# Patient Record
Sex: Male | Born: 1935 | Race: White | Hispanic: No | Smoking: Never smoker
Health system: Southern US, Community
[De-identification: ages and names within clinical notes are randomized; demographics above are authoritative.]

## PROBLEM LIST (undated history)

## (undated) DIAGNOSIS — C679 Malignant neoplasm of bladder, unspecified: Secondary | ICD-10-CM

## (undated) HISTORY — PX: APPENDECTOMY: SHX54

---

## 2001-02-13 ENCOUNTER — Ambulatory Visit (HOSPITAL_COMMUNITY): Admission: RE | Admit: 2001-02-13 | Discharge: 2001-02-13 | Payer: Self-pay | Admitting: Urology

## 2001-02-13 ENCOUNTER — Encounter (INDEPENDENT_AMBULATORY_CARE_PROVIDER_SITE_OTHER): Payer: Self-pay

## 2001-02-16 ENCOUNTER — Other Ambulatory Visit: Admission: RE | Admit: 2001-02-16 | Discharge: 2001-02-16 | Payer: Self-pay | Admitting: Urology

## 2001-02-16 ENCOUNTER — Encounter (INDEPENDENT_AMBULATORY_CARE_PROVIDER_SITE_OTHER): Payer: Self-pay | Admitting: Specialist

## 2001-08-17 ENCOUNTER — Encounter: Admission: RE | Admit: 2001-08-17 | Discharge: 2001-08-17 | Payer: Self-pay | Admitting: Urology

## 2001-08-17 ENCOUNTER — Encounter: Payer: Self-pay | Admitting: Urology

## 2001-08-28 ENCOUNTER — Ambulatory Visit (HOSPITAL_COMMUNITY): Admission: RE | Admit: 2001-08-28 | Discharge: 2001-08-28 | Payer: Self-pay | Admitting: Urology

## 2001-08-28 ENCOUNTER — Encounter (INDEPENDENT_AMBULATORY_CARE_PROVIDER_SITE_OTHER): Payer: Self-pay | Admitting: Specialist

## 2001-11-10 ENCOUNTER — Ambulatory Visit: Admission: RE | Admit: 2001-11-10 | Discharge: 2002-02-08 | Payer: Self-pay | Admitting: Radiation Oncology

## 2001-12-04 ENCOUNTER — Inpatient Hospital Stay (HOSPITAL_COMMUNITY): Admission: RE | Admit: 2001-12-04 | Discharge: 2001-12-29 | Payer: Self-pay | Admitting: Urology

## 2001-12-04 ENCOUNTER — Encounter: Payer: Self-pay | Admitting: Urology

## 2001-12-04 ENCOUNTER — Encounter (INDEPENDENT_AMBULATORY_CARE_PROVIDER_SITE_OTHER): Payer: Self-pay | Admitting: Specialist

## 2001-12-11 ENCOUNTER — Encounter: Payer: Self-pay | Admitting: Surgery

## 2001-12-12 ENCOUNTER — Encounter: Payer: Self-pay | Admitting: Urology

## 2001-12-14 ENCOUNTER — Encounter: Payer: Self-pay | Admitting: Urology

## 2001-12-15 ENCOUNTER — Encounter: Payer: Self-pay | Admitting: Urology

## 2001-12-18 ENCOUNTER — Encounter: Payer: Self-pay | Admitting: Urology

## 2001-12-19 ENCOUNTER — Encounter: Payer: Self-pay | Admitting: Urology

## 2001-12-25 ENCOUNTER — Encounter: Payer: Self-pay | Admitting: Urology

## 2002-02-20 ENCOUNTER — Encounter: Admission: RE | Admit: 2002-02-20 | Discharge: 2002-02-20 | Payer: Self-pay | Admitting: Urology

## 2002-02-20 ENCOUNTER — Encounter: Payer: Self-pay | Admitting: Urology

## 2002-02-22 ENCOUNTER — Ambulatory Visit (HOSPITAL_COMMUNITY): Admission: RE | Admit: 2002-02-22 | Discharge: 2002-02-22 | Payer: Self-pay | Admitting: Urology

## 2002-02-22 ENCOUNTER — Encounter: Payer: Self-pay | Admitting: Urology

## 2004-05-01 ENCOUNTER — Encounter (INDEPENDENT_AMBULATORY_CARE_PROVIDER_SITE_OTHER): Payer: Self-pay | Admitting: Specialist

## 2004-05-01 ENCOUNTER — Inpatient Hospital Stay (HOSPITAL_COMMUNITY): Admission: RE | Admit: 2004-05-01 | Discharge: 2004-05-08 | Payer: Self-pay | Admitting: General Surgery

## 2004-09-17 ENCOUNTER — Ambulatory Visit: Payer: Self-pay | Admitting: Oncology

## 2004-11-26 ENCOUNTER — Ambulatory Visit: Payer: Self-pay | Admitting: Oncology

## 2005-03-08 IMAGING — CR DG CHEST 1V PORT
1 series · 1 of 1 positions shown · non-contrast
Comparison: 04/22/04.

CLINICAL DATA: Shortness of breath.  Status post ventral hernia repair.
 PORTABLE CHEST  - 05/01/04 AT 2320 HOURS

[view not recorded]
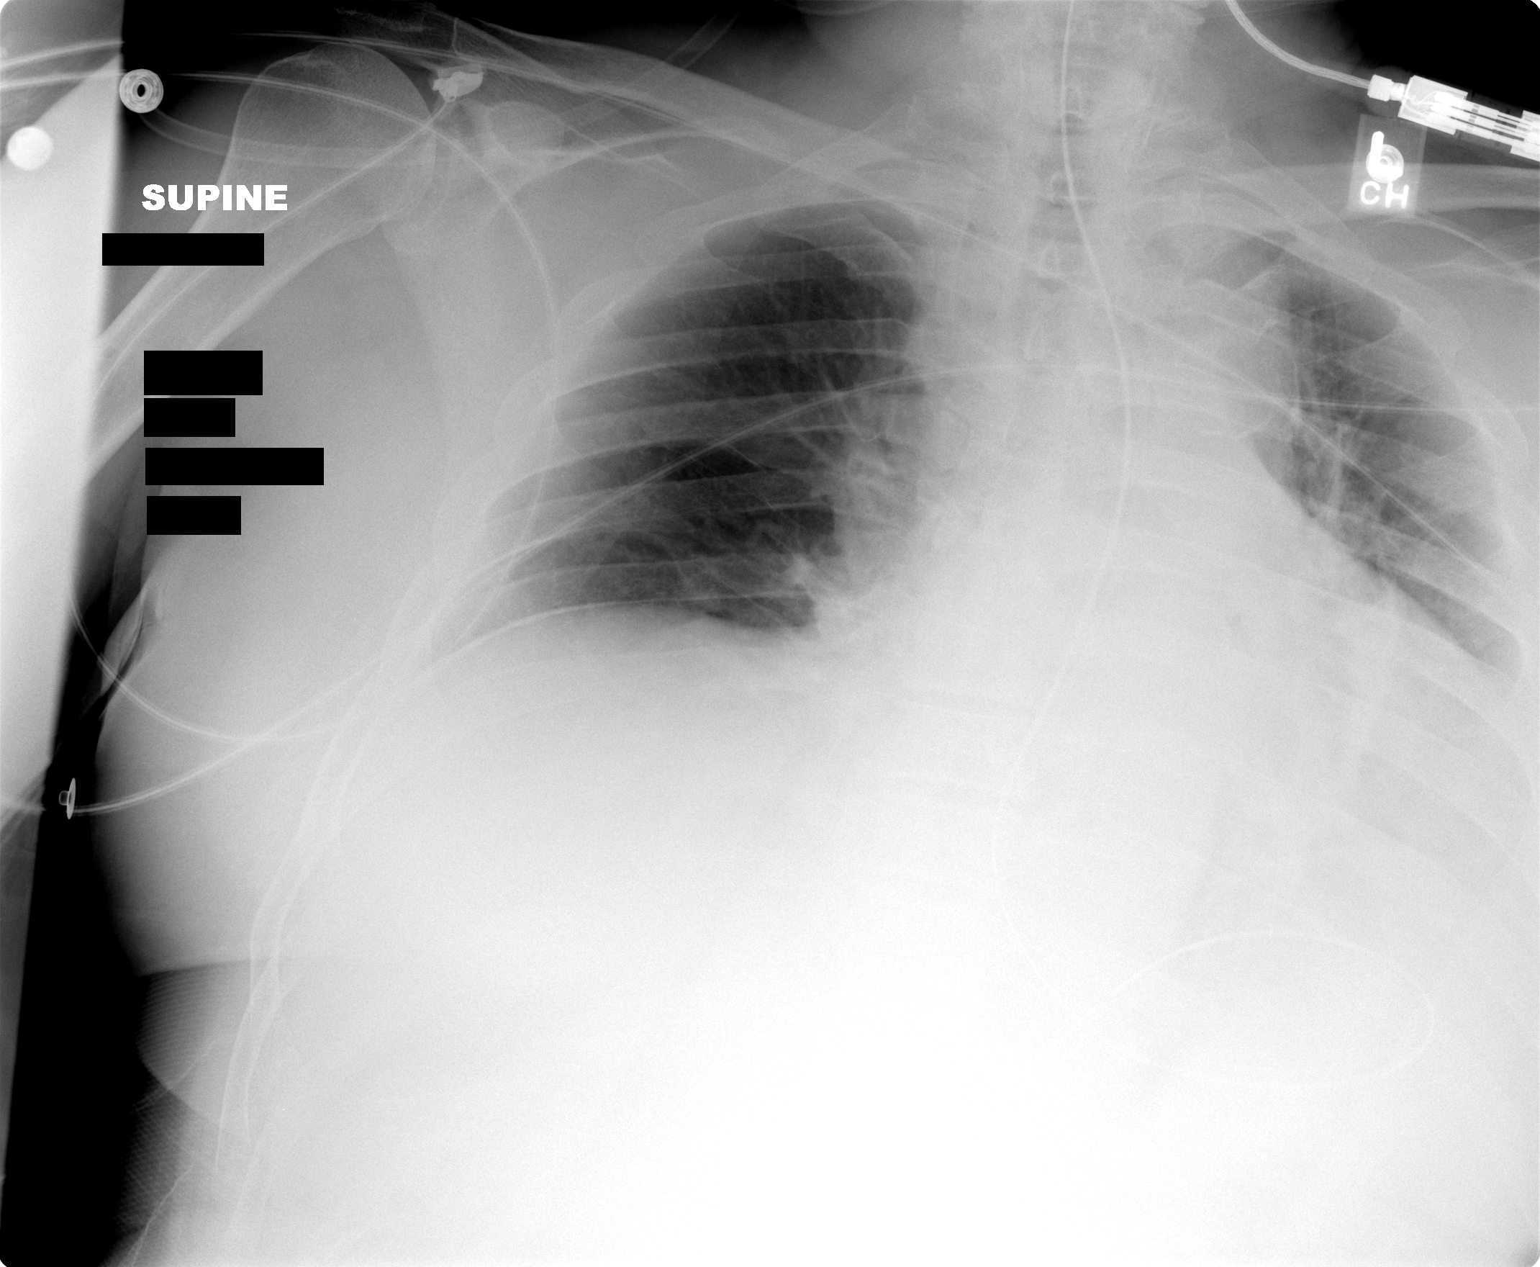

[1 of 1 positions shown; findings below may reference images not displayed]

Interval left lower lobe atelectasis at the medial right lung base and mild bilateral perihilar atelectasis.  The nasogastric tube tip is in the distal stomach. Poor inspiration with a stable grossly normal sized heart.  Unremarkable bones. 
 IMPRESSION
 Poor inspiration with mild bibasilar and bilateral perihilar atelectasis.

## 2005-03-09 IMAGING — CR DG CHEST 1V PORT
1 series · 1 of 1 positions shown · non-contrast
Comparison: none

CLINICAL DATA: Status-post repair of ventral hernia. 
 PORTABLE CHEST ([DATE])
 Comparison is made to the prior chest of 05/01/04.  There is improved atelectasis at the right base with persistent left base atelectasis.  The vascularity is normal.  nasogastric tube is in satisfactory position. 
 IMPRESSION
 1.  Improved right base aeration. 
 2.  Unchanged left base atelectasis.

[view not recorded]
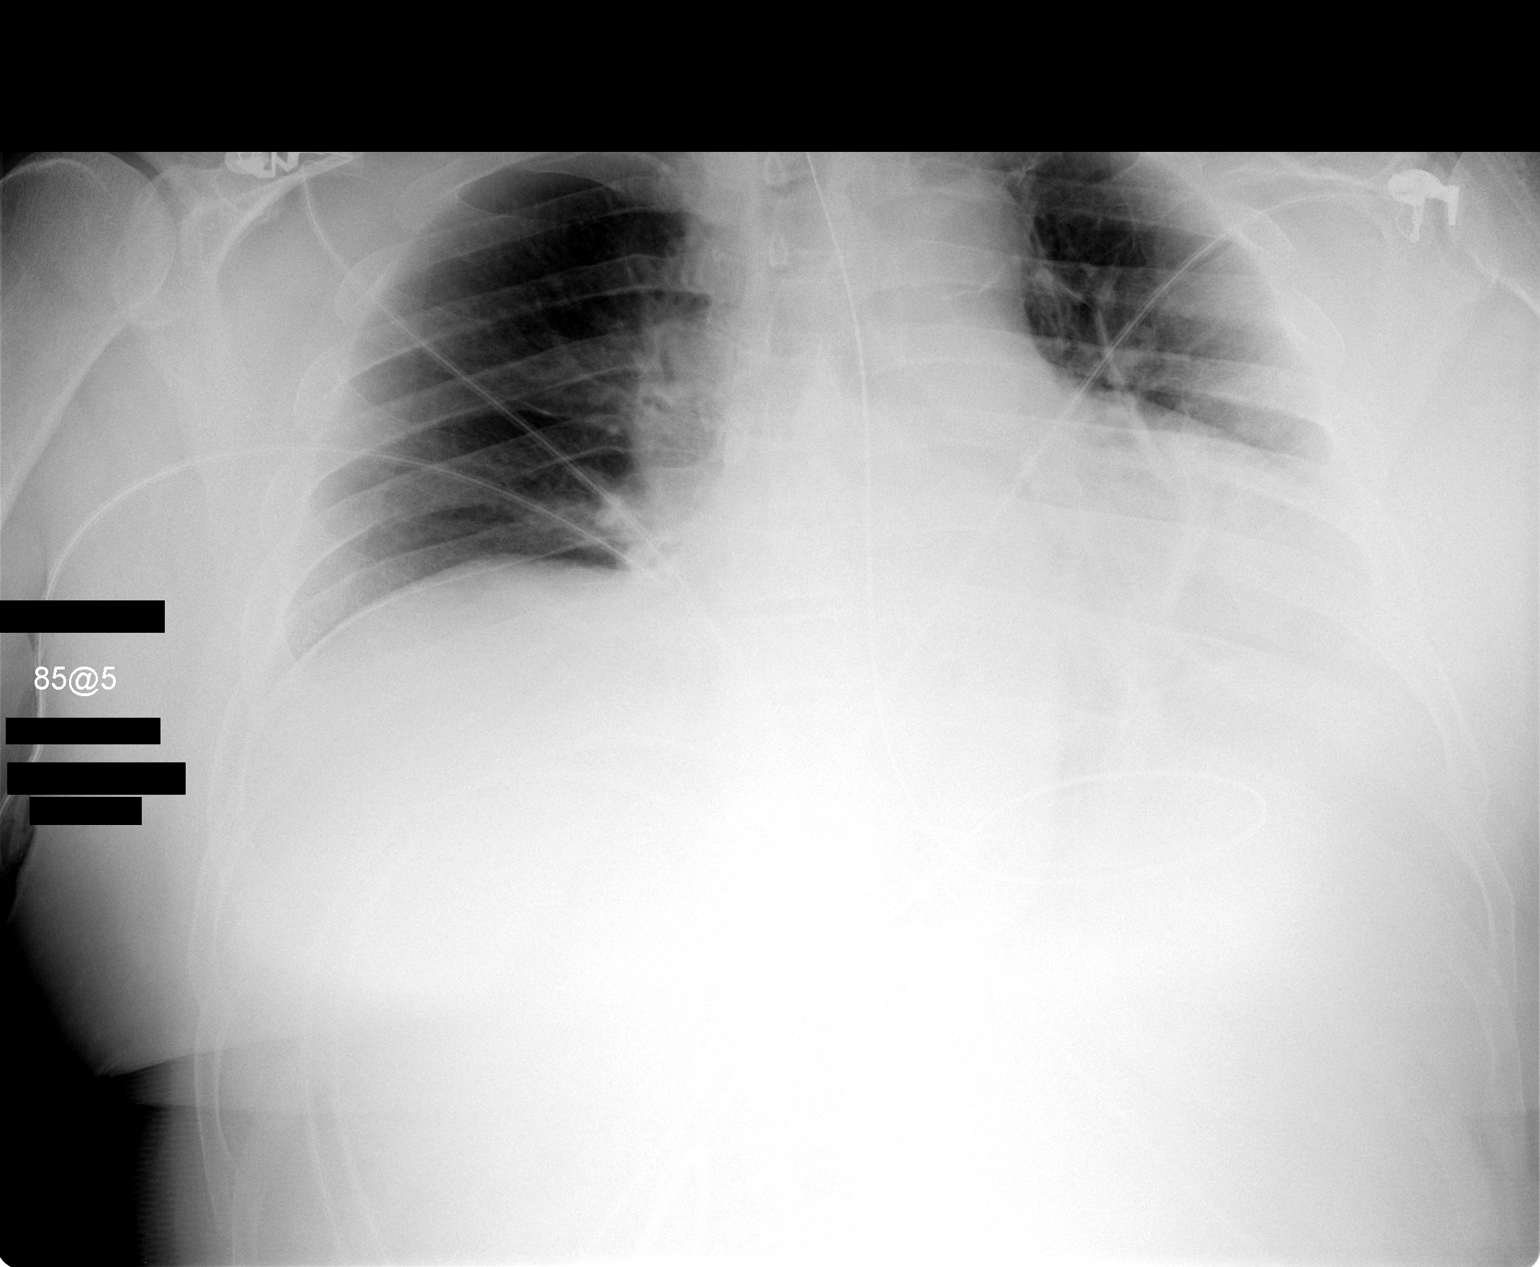

[1 of 1 positions shown; findings below may reference images not displayed]

## 2005-03-18 ENCOUNTER — Ambulatory Visit: Payer: Self-pay | Admitting: Oncology

## 2005-09-16 ENCOUNTER — Ambulatory Visit: Payer: Self-pay | Admitting: Oncology

## 2006-03-04 ENCOUNTER — Ambulatory Visit: Payer: Self-pay | Admitting: Oncology

## 2007-03-09 ENCOUNTER — Ambulatory Visit: Payer: Self-pay | Admitting: Oncology

## 2009-04-17 ENCOUNTER — Ambulatory Visit (HOSPITAL_COMMUNITY): Admission: RE | Admit: 2009-04-17 | Discharge: 2009-04-17 | Payer: Self-pay | Admitting: Urology

## 2011-03-26 NOTE — H&P (Signed)
[facility]  Patient:    Rodney Obrien, Rodney Obrien Visit Number: [id number] MRN: [id number]          Service Type: SUR Location: 3W [id number] 01 Attending Physician:  Laqueta Jean Dictated by:   Vonzell Schlatter Patsi Sears, M.D. Admit Date:  12/04/2001                           History and Physical  HISTORY OF PRESENT ILLNESS:  Rodney Obrien is a 77 year old single white male, with a history of transitional cell carcinoma of the bladder, recurrent.  He has known transitional cell carcinoma, high grade, invasive through the lamina propria (T1), into the superficial muscular layers.  The patient has steadfastly refused invasive therapy such as cystectomy for approximately 9 months, selecting instead BCG therapy, until recurrence occurred with erosion into muscle and chronic bleeding.  The patient was considering bladder salvage chemotherapy and radiation therapy.  Because of chronic bleeding, has now selected cystectomy for treatment.  The patient desires to have continent urinary diversion.  He is status post chemotherapy, consulted with Dr. Gery Pray, and also a radiation therapy consult with Dr. Dan Humphreys. His ______ staining showed no expression.  ALLERGIES:  No known drug allergies.  MEDICATIONS:  None.  Weight is 208 pounds.  PAST SURGICAL HISTORY: 1. Appendectomy. 2. Vasectomy. 3. Hernia repair.  HABITS:  Tobacco:  None.  Alcohol:  None.  ADMISSION PHYSICAL EXAMINATION:  GENERAL:  A short, stalky, relatively obese white male in no acute distress.  HEENT:  Pupils are equal, round and reactive to light and accommodation.  NECK:  Supple.  CHEST:  Clear to auscultation and percussion.  ABDOMEN:  Soft, positive bowel sounds without organomegaly, without masses.  GENITALIA:  Normal male external genitalia with testicles measuring 5 x 5 cm and nontender.  The epididymes and vas are normal.  EXTREMITIES:  Without cyanosis or  edema.  RECTAL:  A 3+ lobular prostate.  The bladder does not appear to be fixed. Nontender, no masses noted.  VITAL SIGNS:  Temperature 96, blood pressure 123/65, pulse rate 69.  ADMITTING IMPRESSION:  Invasive poorly differentiated transitional cell carcinoma of the bladder for radical cystectomy and continent urinary diversion. Dictated by:   Vonzell Schlatter Patsi Sears, M.D. Attending Physician:  Laqueta Jean DD:  12/20/01 TD:  12/20/01 Job: [id number]

## 2011-03-26 NOTE — Op Note (Signed)
NAMEFONG, MCCARRY                       ACCOUNT NO.:  [id number]   MEDICAL RECORD NO.:  [id number]                   PATIENT TYPE:  INP   LOCATION:  Z610                                 FACILITY:  [facility]   PHYSICIAN:  Leonie Man, M.D.                DATE OF BIRTH:  09/23/1926   DATE OF PROCEDURE:  05/01/2004  DATE OF DISCHARGE:                                 OPERATIVE REPORT   PREOPERATIVE DIAGNOSIS:  Ventral hernia.   POSTOPERATIVE DIAGNOSIS:  Ventral hernia.   PROCEDURE:  1. Extensive adhesiolysis with small bowel resection and anastomosis.  2. Repair of ventral hernia.   SURGEON:  Dr. Leonie Man   ASSISTANT:  Dr. Cicero Duck   ANESTHESIA:  General.   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  He had had history of  drainage from his wound consistent with what seemed to have been possibly a  dehiscence or a fistula.  He presents now with a very large ventral hernia  which extends from the subxiphoid area in the epigastrium down to the pubis,  and the lateral edges of the hernia extend to the nipple lines laterally at  the greatest diametric width of the hernia.  The patient comes to the  operating room after the risks and potential benefits of surgery have been  fully discussed, all questions answered, consent obtained.   DESCRIPTION OF PROCEDURE:  Following the induction of satisfactory general  anesthesia, the patient was positioned supinely, and his abdomen is prepped  and draped to be included in a sterile operative field.  Exploration of the  hernia was carried out through a very long midline incision; however, upon  initiating the incision, it was immediately noted that the skin was  extremely thin, and there was immediately subjacent to the skin a portion of  small intestine which was opened.  The remainder of the incision was opened  and the small bowel area dissected  free from under the skin.  In doing so,  two additional enterostomies were created in this loop of small bowel which  was adhered to the undersurface of the skin.  What followed then was a  rather extensive adhesiolysis to free up the bowel that was attached to the  skin, also to free up all of the bowel that was adhered to the anterior  abdominal wall.  This took an additional 1-1/2 hours to completely loose the  adhesions to the abdominal wall.  Following this, a small bowel resection  was carried out by transecting a portion of bowel which measured  approximately 1 foot proximally and distally with the GIA stapler.  The  intervening mesentery taken between clamps and secured with ties of 2-0  silk.  A functional end-to-end anastomosis carried out with GIA stapler and  a TA 55 stapler.  The resulting anastomosis was  noted to be widely patent  and the mesentery was closed with interrupted 3-0 Vicryl sutures.  Because  of the large enterostomy, it obviated the need for a need for a mesh  closure.  Consequently, hernia was closed primarily first by elevating large  flaps laterally, carrying skin flaps for approximately 10 inches laterally  in both directions.  The attenuated fascia and sac were then amputated and  removed from the operative field.  The relaxing incisions were carried out  laterally along the entire length of the abdomen so as to effect closure of  this large ventral hernia.  I placed a piece of Vicryl mesh on top of the  viscera so as to provide adhesions to the anterior abdominal wall.  I then  closed the abdominal wall with interrupted sutures of #1 Novofil.  Abdominal  wall had been closed.  All the areas were inspected and checked for  hemostasis and the subcutaneous tissues and skin flaps were irrigated with  multiple aliquots of normal saline.  Two 10 [place] Blake drains were brought  into the wounds for drainage of the flaps.  The subcutaneous tissues were  then  closed with a running suture of #2-0 Vicryl, and then the skin was  closed with stainless steel staples.  A sterile compressive dressing placed  on the wound, the anesthetic reversed, and the patient removed from the  operating room to the recovery room in stable condition.  He tolerated the  procedure well.                                               Leonie Man, M.D.    PB/MEDQ  D:  05/01/2004  T:  05/01/2004  Job:  [id number]   cc:   Lynelle Smoke I. Patsi Sears, M.D.  [address]  [place]  [place] [address]  Fax: [phone]   Dr. Lurene Shadow (2 copies)

## 2011-03-26 NOTE — Discharge Summary (Signed)
NAMEDAVIT, Rodney Obrien                       ACCOUNT NO.:  [id number]   MEDICAL RECORD NO.:  [id number]                   PATIENT TYPE:  INP   LOCATION:  0379                                 FACILITY:  [facility]   PHYSICIAN:  Leonie Man, M.D.                DATE OF BIRTH:  06-12-1926   DATE OF ADMISSION:  05/01/2004  DATE OF DISCHARGE:  05/08/2004                                 DISCHARGE SUMMARY   ADMISSION DIAGNOSES:  Ventral hernia.   DISCHARGE DIAGNOSES:  Ventral hernia.   PROCEDURES IN HOSPITAL:  1.  Exploratory laparotomy with extensive adhesiolysis of small bowel      adhesions.  2.  Small bowel resection.  3.  Ventral hernia repair.   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 He had a  history of drainage from his wound consistent with what seemed to have been  a dehiscence and/or fistula. He presented without me with a very large  ventral hernia extending from the subxiphoid area in the epigastrium all the  way down to his pubis in the lateral edges of the hernia extending laterally  to his nipple lines.  He was taken to the operating room on the day of  admission which was May 01, 2004 and underwent exploratory laparotomy.  During the course of exploration and adhesiolysis, he suffered and  enterostomy and had to undergo bowel resection. Because of this, a synthetic  mesh could not be placed to repair his hernia. He underwent a primary  ventral hernia repair. His postoperative course is benign and he is being  discharged to be followed up in the office in two weeks.   DISCHARGE MEDICATIONS:  Vicodin 1-2 every 4h. p.r.n. for pain.   ACTIVITY:  As tolerated.   DIET:  Unrestricted.                                               Leonie Man, M.D.    PB/MEDQ  D:  07/16/2004  T:  07/17/2004  Job:  [id number]

## 2015-01-29 ENCOUNTER — Inpatient Hospital Stay (HOSPITAL_COMMUNITY)
Admission: EM | Admit: 2015-01-29 | Discharge: 2015-02-01 | DRG: 683 | Disposition: A | Discharge status: Home / Self Care | Payer: Medicare Other | Attending: Oncology | Admitting: Oncology

## 2015-01-29 ENCOUNTER — Inpatient Hospital Stay (HOSPITAL_COMMUNITY): Payer: Medicare Other

## 2015-01-29 ENCOUNTER — Encounter (HOSPITAL_COMMUNITY): Payer: Self-pay | Admitting: Emergency Medicine

## 2015-01-29 DIAGNOSIS — N189 Chronic kidney disease, unspecified: Secondary | ICD-10-CM | POA: Diagnosis present

## 2015-01-29 DIAGNOSIS — N179 Acute kidney failure, unspecified: Secondary | ICD-10-CM | POA: Diagnosis present

## 2015-01-29 DIAGNOSIS — Z515 Encounter for palliative care: Secondary | ICD-10-CM

## 2015-01-29 DIAGNOSIS — Z859 Personal history of malignant neoplasm, unspecified: Secondary | ICD-10-CM | POA: Diagnosis present

## 2015-01-29 DIAGNOSIS — E872 Acidosis, unspecified: Secondary | ICD-10-CM | POA: Diagnosis present

## 2015-01-29 DIAGNOSIS — N390 Urinary tract infection, site not specified: Secondary | ICD-10-CM | POA: Diagnosis present

## 2015-01-29 DIAGNOSIS — E291 Testicular hypofunction: Secondary | ICD-10-CM | POA: Diagnosis present

## 2015-01-29 DIAGNOSIS — D509 Iron deficiency anemia, unspecified: Secondary | ICD-10-CM | POA: Diagnosis present

## 2015-01-29 DIAGNOSIS — R634 Abnormal weight loss: Secondary | ICD-10-CM | POA: Diagnosis present

## 2015-01-29 DIAGNOSIS — K529 Noninfective gastroenteritis and colitis, unspecified: Secondary | ICD-10-CM | POA: Diagnosis present

## 2015-01-29 DIAGNOSIS — Z66 Do not resuscitate: Secondary | ICD-10-CM | POA: Diagnosis not present

## 2015-01-29 DIAGNOSIS — C679 Malignant neoplasm of bladder, unspecified: Secondary | ICD-10-CM | POA: Diagnosis present

## 2015-01-29 DIAGNOSIS — D631 Anemia in chronic kidney disease: Secondary | ICD-10-CM | POA: Diagnosis present

## 2015-01-29 DIAGNOSIS — N1339 Other hydronephrosis: Secondary | ICD-10-CM | POA: Diagnosis present

## 2015-01-29 DIAGNOSIS — D649 Anemia, unspecified: Secondary | ICD-10-CM | POA: Diagnosis present

## 2015-01-29 DIAGNOSIS — Z906 Acquired absence of other parts of urinary tract: Secondary | ICD-10-CM | POA: Diagnosis present

## 2015-01-29 DIAGNOSIS — N19 Unspecified kidney failure: Secondary | ICD-10-CM

## 2015-01-29 DIAGNOSIS — Z9350 Unspecified cystostomy status: Secondary | ICD-10-CM | POA: Diagnosis not present

## 2015-01-29 DIAGNOSIS — E860 Dehydration: Secondary | ICD-10-CM | POA: Diagnosis not present

## 2015-01-29 DIAGNOSIS — Z8551 Personal history of malignant neoplasm of bladder: Secondary | ICD-10-CM | POA: Diagnosis not present

## 2015-01-29 DIAGNOSIS — K2270 Barrett's esophagus without dysplasia: Secondary | ICD-10-CM | POA: Diagnosis present

## 2015-01-29 DIAGNOSIS — R8290 Unspecified abnormal findings in urine: Secondary | ICD-10-CM | POA: Diagnosis not present

## 2015-01-29 DIAGNOSIS — E875 Hyperkalemia: Secondary | ICD-10-CM | POA: Diagnosis present

## 2015-01-29 HISTORY — DX: Malignant neoplasm of bladder, unspecified: C67.9

## 2015-01-29 LAB — CBC WITH DIFFERENTIAL/PLATELET
BASOS ABS: 0 10*3/uL (ref 0.0–0.1)
BASOS PCT: 0 % (ref 0–1)
Eosinophils Absolute: 0.1 10*3/uL (ref 0.0–0.7)
Eosinophils Relative: 1 % (ref 0–5)
HCT: 27.2 % — ABNORMAL LOW (ref 39.0–52.0)
Hemoglobin: 9.3 g/dL — ABNORMAL LOW (ref 13.0–17.0)
Lymphocytes Relative: 17 % (ref 12–46)
Lymphs Abs: 1.9 10*3/uL (ref 0.7–4.0)
MCH: 29.9 pg (ref 26.0–34.0)
MCHC: 34.2 g/dL (ref 30.0–36.0)
MCV: 87.5 fL (ref 78.0–100.0)
MONO ABS: 0.7 10*3/uL (ref 0.1–1.0)
Monocytes Relative: 7 % (ref 3–12)
NEUTROS ABS: 7.9 10*3/uL — AB (ref 1.7–7.7)
NEUTROS PCT: 75 % (ref 43–77)
PLATELETS: 280 10*3/uL (ref 150–400)
RBC: 3.11 MIL/uL — ABNORMAL LOW (ref 4.22–5.81)
RDW: 17.5 % — AB (ref 11.5–15.5)
WBC: 10.6 10*3/uL — ABNORMAL HIGH (ref 4.0–10.5)

## 2015-01-29 LAB — BASIC METABOLIC PANEL
Anion gap: 9 (ref 5–15)
BUN: 110 mg/dL — AB (ref 6–23)
CHLORIDE: 123 mmol/L — AB (ref 96–112)
CO2: 12 mmol/L — ABNORMAL LOW (ref 19–32)
CREATININE: 4.8 mg/dL — AB (ref 0.50–1.35)
Calcium: 8.7 mg/dL (ref 8.4–10.5)
GFR, EST AFRICAN AMERICAN: 11 mL/min — AB (ref >90)
GFR, EST NON AFRICAN AMERICAN: 10 mL/min — AB (ref >90)
Glucose, Bld: 72 mg/dL (ref 70–99)
Potassium: 5 mmol/L (ref 3.5–5.1)
Sodium: 144 mmol/L (ref 135–145)

## 2015-01-29 LAB — CK: Total CK: 47 U/L (ref 7–232)

## 2015-01-29 LAB — COMPREHENSIVE METABOLIC PANEL
ALT: 19 U/L (ref 0–53)
AST: 14 U/L (ref 0–37)
Albumin: 3.6 g/dL (ref 3.5–5.2)
Alkaline Phosphatase: 58 U/L (ref 39–117)
Anion gap: 8 (ref 5–15)
BUN: 109 mg/dL — ABNORMAL HIGH (ref 6–23)
CALCIUM: 9.3 mg/dL (ref 8.4–10.5)
CO2: 11 mmol/L — ABNORMAL LOW (ref 19–32)
CREATININE: 5.3 mg/dL — AB (ref 0.50–1.35)
Chloride: 125 mmol/L — ABNORMAL HIGH (ref 96–112)
GFR, EST AFRICAN AMERICAN: 10 mL/min — AB (ref >90)
GFR, EST NON AFRICAN AMERICAN: 9 mL/min — AB (ref >90)
GLUCOSE: 114 mg/dL — AB (ref 70–99)
POTASSIUM: 6.1 mmol/L — AB (ref 3.5–5.1)
SODIUM: 144 mmol/L (ref 135–145)
TOTAL PROTEIN: 7.2 g/dL (ref 6.0–8.3)
Total Bilirubin: 0.5 mg/dL (ref 0.3–1.2)

## 2015-01-29 LAB — URINALYSIS, ROUTINE W REFLEX MICROSCOPIC
BILIRUBIN URINE: NEGATIVE
GLUCOSE, UA: NEGATIVE mg/dL
KETONES UR: NEGATIVE mg/dL
Nitrite: NEGATIVE
Protein, ur: 100 mg/dL — AB
Specific Gravity, Urine: 1.012 (ref 1.005–1.030)
Urobilinogen, UA: 0.2 mg/dL (ref 0.0–1.0)
pH: 6.5 (ref 5.0–8.0)

## 2015-01-29 LAB — RETICULOCYTES
RBC.: 2.7 MIL/uL — ABNORMAL LOW (ref 4.22–5.81)
Retic Count, Absolute: 18.9 10*3/uL — ABNORMAL LOW (ref 19.0–186.0)
Retic Ct Pct: 0.7 % (ref 0.4–3.1)

## 2015-01-29 LAB — URINE MICROSCOPIC-ADD ON

## 2015-01-29 MED ORDER — HEPARIN SODIUM (PORCINE) 5000 UNIT/ML IJ SOLN
5000.0000 [IU] | Freq: Three times a day (TID) | INTRAMUSCULAR | Status: DC
  Administered 2015-01-29 – 2015-01-31 (×6): 5000 [IU] via SUBCUTANEOUS
  Filled 2015-01-29 (×7): qty 1

## 2015-01-29 MED ORDER — CEFTRIAXONE SODIUM IN DEXTROSE 20 MG/ML IV SOLN
1.0000 g | Freq: Once | INTRAVENOUS | Status: AC
  Administered 2015-01-30: 1 g via INTRAVENOUS
  Filled 2015-01-29: qty 50

## 2015-01-29 MED ORDER — SODIUM CHLORIDE 0.45 % IV SOLN
INTRAVENOUS | Status: DC
  Administered 2015-01-29: 17:00:00 via INTRAVENOUS

## 2015-01-29 MED ORDER — DEXTROSE 50 % IV SOLN
50.0000 mL | Freq: Once | INTRAVENOUS | Status: AC
  Administered 2015-01-29 (×2): 50 mL via INTRAVENOUS
  Filled 2015-01-29: qty 50

## 2015-01-29 MED ORDER — DEXTROSE 50 % IV SOLN
INTRAVENOUS | Status: AC
  Administered 2015-01-29: 50 mL via INTRAVENOUS
  Filled 2015-01-29: qty 50

## 2015-01-29 MED ORDER — SODIUM CHLORIDE 0.9 % IV BOLUS (SEPSIS)
500.0000 mL | Freq: Once | INTRAVENOUS | Status: AC
  Administered 2015-01-29: 500 mL via INTRAVENOUS

## 2015-01-29 MED ORDER — DEXTROSE 5 % IV SOLN
1.0000 g | Freq: Once | INTRAVENOUS | Status: AC
  Administered 2015-01-29: 1 g via INTRAVENOUS
  Filled 2015-01-29: qty 10

## 2015-01-29 MED ORDER — INSULIN ASPART 100 UNIT/ML ~~LOC~~ SOLN
10.0000 [IU] | Freq: Once | SUBCUTANEOUS | Status: AC
  Administered 2015-01-29: 10 [IU] via SUBCUTANEOUS

## 2015-01-29 MED ORDER — SODIUM BICARBONATE 8.4 % IV SOLN
INTRAVENOUS | Status: DC
  Administered 2015-01-29: 14:00:00 via INTRAVENOUS
  Filled 2015-01-29 (×4): qty 150

## 2015-01-29 MED ORDER — SODIUM CHLORIDE 0.9 % IV SOLN: INTRAVENOUS | Status: DC

## 2015-01-29 MED ORDER — FERROUS SULFATE 325 (65 FE) MG PO TABS
325.0000 mg | ORAL_TABLET | Freq: Every day | ORAL | Status: DC
  Administered 2015-01-29 – 2015-02-01 (×4): 325 mg via ORAL
  Filled 2015-01-29 (×6): qty 1

## 2015-01-29 MED ORDER — SODIUM POLYSTYRENE SULFONATE 15 GM/60ML PO SUSP
15.0000 g | Freq: Once | ORAL | Status: AC
  Administered 2015-01-29: 15 g via ORAL
  Filled 2015-01-29: qty 60

## 2015-01-29 MED ORDER — STERILE WATER FOR INJECTION IV SOLN
INTRAVENOUS | Status: DC
  Administered 2015-01-29: 21:00:00 via INTRAVENOUS
  Filled 2015-01-29 (×3): qty 850

## 2015-01-29 NOTE — H&P (Signed)
Date: 01/29/2015               Patient Name:  Rodney Obrien MRN: [id number]  DOB: April 11, 1926 Age / Sex: 81 y.o., male   PCP: No primary care provider on file.         Medical Service: Internal Medicine Teaching Service         Attending Physician: Dr. Annia Belt, MD    First Contact: Dr. Venita Lick Pager: [phone]  Second Contact: Dr. Bing Neighbors Pager: [phone]       After Hours (After 5p/  First Contact Pager: [phone]  weekends / holidays): Second Contact Pager: [phone]   Chief Complaint: Called by PCP to go to ED for abnormal creatinine and potassium  History of Present Illness: Mr. 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 Two weeks ago, he experienced vomiting and diarrhea that lasted for about 7 days and subsided on their own. His daughters, who check on him often, also reported that he appeared sick and thinner than usual. According to his records, he has lost 29 pounds over the past 9 months. He has experienced no subjective fever, chills, dysuria, hematuria, night sweats or pain.  Of note, the patient self-catheterizes and has done so since his surgery in 2000; his urologist is Dr. Era Bumpers. According to his family, he often repeatedly uses his catheter rather than using a new catheter each time as directed. Additionally, the patient's urinalysis in 06/2014 was positive for bacteria, WBC and protein.   Meds: Medications Prior to Admission  Medication Sig Dispense Refill  . esomeprazole (NEXIUM) 40 MG capsule Take 40 mg by mouth daily at 12 noon.    . ferrous sulfate 325 (65 FE) MG tablet Take 325 mg by mouth daily with breakfast.    . Misc Natural Products (OSTEO BI-FLEX ADV TRIPLE ST PO) Take 2 tablets by mouth daily.    . Multiple Vitamins-Minerals (CENTRUM ULTRA MENS PO) Take 1 tablet by  mouth daily.    . Probiotic Product (ALIGN) 4 MG CAPS Take 1 capsule by mouth daily.      Allergies: Allergies as of 01/29/2015  . (No Known Allergies)   Past Medical History  Diagnosis Date  . Bladder cancer    Past Surgical History  Procedure Laterality Date  . Appendectomy    Cystoprostatectomy with ileoneobladder placement (complicated by mid-exploratory laparotomy enterostomy that required bowel resection) No family history on file. History   Social History  . Marital Status: Widowed    Spouse Name: N/A  . Number of Children: N/A  . Years of Education: N/A   Occupational History  . Retired Company secretary   Social History Main Topics  . Smoking status: Never Smoker   . Smokeless tobacco: Not on file  . Alcohol Use: No  . Drug Use: Not on file  . Sexual Activity: Not on file   Other Topics Concern  . Not on file   Social History Narrative  . Lives alone in 9 bedroom house    Review of Systems: General: recent weight loss (see HPI) Skin: no rashes or lesions HEENT: no headaches, no changes in vision Cardiac: no chest pain or palpitations Respiratory: no shortness of breath or wheezing GI: diarrhea and vomiting for one week starting two weeks ago Urinary: no dysuria or hematuria Msk:  no complaint of joint pain  Psychiatric: no history of anxiety or depression  Physical Exam: Blood pressure 118/57, pulse 81, temperature 97.8 F (36.6 C), temperature source Oral, resp. rate 18, height 5\' 8"  (1.727 m), weight 147 lb 9.6 oz (66.951 kg), SpO2 94 %. Appearance: in NAD, sitting at side of bed with 2 daughters and one step-daughter at bedside, hard of hearing (R side better than left) HEENT: AT/[place], PERRL, EOMi, no lymphadenopathy, MMM Heart: RRR, normal S1S2, no murmurs Lungs: CTAB, no wheezes Abdomen: BS+, soft, nontender, large vertical surgical scar that is well-healed Musculoskeletal: normal ROM, multiple DIP joints enlarged and deformed, nontender  Extremities: no  edema Neurologic: A&Ox3, grossly intact Skin: no rashes or lesions   Lab results: Basic Metabolic Panel:  Recent Labs  01/29/15 1124  NA 144  K 6.1*  CL 125*  CO2 11*  GLUCOSE 114*  BUN 109*  CREATININE 5.30*  CALCIUM 9.3   Liver Function Tests:  Recent Labs  01/29/15 1124  AST 14  ALT 19  ALKPHOS 58  BILITOT 0.5  PROT 7.2  ALBUMIN 3.6   CBC:  Recent Labs  01/29/15 1124  WBC 10.6*  NEUTROABS 7.9*  HGB 9.3*  HCT 27.2*  MCV 87.5  PLT 280   Urinalysis:  Recent Labs  01/29/15 1207  COLORURINE YELLOW  LABSPEC 1.012  PHURINE 6.5  GLUCOSEU NEGATIVE  HGBUR MODERATE*  BILIRUBINUR NEGATIVE  KETONESUR NEGATIVE  PROTEINUR 100*  UROBILINOGEN 0.2  NITRITE NEGATIVE  LEUKOCYTESUR LARGE*   Other results: EKG 11AM 3/23: rate 94, atrial fibrillation, artifact  EKG 6PM 3/23: rate 87, regular rhythm, first degree heart block  Assessment & Plan by Problem: Active Problems:   Acute renal failure   AKI (acute kidney injury)  Mr. Rodney Obrien is an 81 yo man with a history of bladder cancer for which he underwent cystoprostatectomy with ileoneobladder placement who self-catheterizes at home. He has been found to have acute on chronic renal failure, a urinary tract infection, anemia and a significant recent weight loss.   Acute on Chronic Renal Failure: Found on admission to be hyperkalemic to 6.1 and acidotic (CO2 11) today with a massive rise in his creatinine to 5.3. On UA, he had proteinuria. Most recent known kidney function was in 2013 when creatinine was 1.7, BUN 27, K 4.7. Does not appear to have had a workup for his renal function. He received 3 amps of bicarbonate with dextrose, insulin and kayexelate in the ED. No peaked T waves on EKG, no symptoms. He did appear dry on initial exam and felt better with rehydration; but it is unclear how long this has been ongoing (his AKI is not likely to have been simply due to dehydration). No history of  hypertension. - Appreciate nephrology seeing the patient in the ED; may need to re-consult if this patient needs HD - IVF sodium bicarbonate in sterile water 125 mL/hr - Renal ultrasound (kidneys and bladder) - PTH pending - SPEP and UPEP pending - BMET for 7PM to trend abnormal values  Urinary Tract Infection: UA revealed large leukocytes, moderate hemoglobin, many WBC. There was hemoglobin in his urine, but this is to be expected in someone who self-catheterizes.  - Ceftriaxone per pharmacy - Urine culture pending  Weight Loss: 06/2014 he was 176 lbs-->147 lbs. Does admit to decreased oral intake over last 2 weeks. Concerning with history of bladder cancer and Barrett's esophagus.  History of High Grade Bladder Cancer: Patient underwent cystoprostatectomy and now self-catheterizes.  His significant weight loss is concerning for recurrence. He follows with Dr. Gaynelle Arabian (and previously followed with Dr. Hinton Rao, oncology).  Barrett's Esophagus: Patient is managed by Dr. Eustaquio Boyden (GI) and takes Nexium 40 mg daily.  Iron Deficiency Anemia: Has required transfusion in the past. 2/16 hemoglobin 10.5. Today, his hemoglobin is 9.3.  - Continue to trend; CBC in am. Will transfuse under 7 - Anemia panel pending - Continue ferrous sulfate tablet 325 mg daily (home medication)   Hypogonadism: Receives hormone replacement therapy (testosterone). On Axiron at home.  Diet: Regular  DVT Ppx: heparin Venus  Dispo: Disposition is deferred at this time, awaiting improvement of current medical problems. Anticipated discharge in approximately 2-3 day(s). Contact: Coralyn Mark (step-daughter) [phone].  The patient does have a current PCP (Dr. Eustaquio Boyden (GI)) and does not need an [facility] hospital follow-up appointment after discharge.  The patient does not have transportation limitations that hinder transportation to clinic appointments.  Signed: Karlene Einstein, MD 01/29/2015, 6:10 PM

## 2015-01-29 NOTE — Progress Notes (Signed)
Received report from Santiago Glad, ED, [place].  Earleen Reaper RN-BC, [employer]

## 2015-01-29 NOTE — ED Notes (Addendum)
Pt from home with family for eval of elevated creatine and potassium at PCP office. Pt states recent diarrhea x few weeks and went to see pcp for this.  Pt denies any pain, hx of bladder cancer. Pt also denies any urinary symptoms at this time, states he self caths himself for urine.

## 2015-01-29 NOTE — ED Provider Notes (Signed)
CSN: [id number]     Arrival date & time 01/29/15  1047 History   First MD Initiated Contact with Patient 01/29/15 1102     Chief Complaint  Patient presents with  . Abnormal Lab     (Consider location/radiation/quality/duration/timing/severity/associated sxs/prior Treatment) HPI Comments: Patient is an 81 year old male with history of bladder cancer and bladder reconstruction surgery in the past. He presents for evaluation of elevated laboratory studies. According to the family who is at bedside, he has "not been quite right" for the past several days. He went to see his primary doctor yesterday who obtained blood work. They were called today and notified that he was in renal failure and should come to the ER to be evaluated. He is found to have a BUN of 90, creatinine of 5, and potassium of 6.3. He denies to me he is having any discomfort or any pain. He reports normal urine output and is actually been performing self caths at home recently due to difficulty voiding. He last catheterized himself this morning and got out a significant quantity of urine. He denies any fevers or chills. He denies any abdominal or back pain. He denies any chest pain or any shortness of breath. He does report some diarrhea over the past week.  The history is provided by the patient.    Past Medical History  Diagnosis Date  . Bladder cancer    No past surgical history on file. No family history on file. History  Substance Use Topics  . Smoking status: Not on file  . Smokeless tobacco: Not on file  . Alcohol Use: Not on file    Review of Systems  All other systems reviewed and are negative.     Allergies  Review of patient's allergies indicates not on file.  Home Medications   Prior to Admission medications   Not on File   BP 136/76 mmHg  Pulse 97  Temp(Src) 97.7 F (36.5 C) (Oral)  Resp 20  SpO2 97% Physical Exam  Constitutional: He is oriented to person, place, and time. He appears  well-developed and well-nourished. No distress.  HENT:  Head: Normocephalic and atraumatic.  Mucous membranes are somewhat dry.  Neck: Normal range of motion. Neck supple.  Cardiovascular: Normal rate, regular rhythm and normal heart sounds.   No murmur heard. Pulmonary/Chest: Effort normal and breath sounds normal. No respiratory distress. He has no wheezes.  Abdominal: Soft. Bowel sounds are normal. He exhibits no distension. There is no tenderness.  Musculoskeletal: Normal range of motion. He exhibits no edema.  Lymphadenopathy:    He has no cervical adenopathy.  Neurological: He is alert and oriented to person, place, and time.  Skin: Skin is warm and dry. He is not diaphoretic.  Nursing note and vitals reviewed.   ED Course  Procedures (including critical care time) Labs Review Labs Reviewed  COMPREHENSIVE METABOLIC PANEL  CBC WITH DIFFERENTIAL/PLATELET  URINALYSIS, ROUTINE W REFLEX MICROSCOPIC    Imaging Review No results found.   EKG Interpretation   Date/Time:  Wednesday January 29 2015 11:17:18 EDT Ventricular Rate:  94 PR Interval:    QRS Duration: 71 QT Interval:  339 QTC Calculation: 424 R Axis:   4 Text Interpretation:  Atrial fibrillation Confirmed by Beau Fanny  MD, Nathaneil Canary  [phone]) on 01/29/2015 12:37:16 PM      MDM   Final diagnoses:  None    Patient is an 81 year old male who presents for evaluation of abnormal labs. He had laboratory studies performed yesterday  at his primary doctor's office and was told to come here for further workup of these.  He has a markedly elevated BUN and creatinine along with acidosis and hyperkalemia. He has no EKG changes and otherwise appears hemodynamically stable. I've spoken with Dr. Justin Mend from nephrology who has recommended starting a bicarbonate drip. 3 A of bicarbonate were in place and a bag of D5 and started at 125 an hour. He will be admitted to the internal medicine teaching service.    Veryl Speak,  MD 01/29/15 [phone]

## 2015-01-29 NOTE — ED Notes (Signed)
Gave pt water. Pt does not want food at this time

## 2015-01-29 NOTE — Consult Note (Signed)
Referring Provider: No ref. provider found Primary Care Physician:  No primary care provider on file. 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 He has been in his usual state of good health living independently in [place] until about 1 month ago. He began to complain of weight loss and poor appetite, metallic taste in mouth. He denies pain but saw a gastroenterologist in [place], Dr Odie Sera. He was placed on a protein pump inhibitor and told to take yogurt daily. He lost about 12 lbs in 1 month and over the last 2 days became very week and was sent to the ER. He intermittently self catheterizes and has a neobladder after the resection of a bladder cancer.   In the ER he was found to be hyperkalemic and acidotic and had an elevated Creatinine. He was thought to be dehydrated and IV fluids were started at 125 cc/hr. Being acidotic 3 amps of bicarbonate were added to 1 L D5W.     Past Medical History  Diagnosis Date  . Bladder cancer     Past Surgical History  Procedure Laterality Date  . Appendectomy      Prior to Admission medications   Medication Sig Start Date End Date Taking? Authorizing Provider  esomeprazole (NEXIUM) 40 MG capsule Take 40 mg by mouth daily at 12 noon.   Yes Historical Provider, MD  ferrous sulfate 325 (65 FE) MG tablet Take 325 mg by mouth daily with breakfast.   Yes Historical Provider, MD  Misc Natural Products (OSTEO BI-FLEX ADV TRIPLE ST PO) Take 2 tablets by mouth daily.   Yes Historical Provider, MD  Multiple Vitamins-Minerals (CENTRUM ULTRA MENS PO) Take 1 tablet by mouth daily.   Yes Historical Provider, MD  Probiotic Product (ALIGN) 4 MG CAPS Take 1 capsule by mouth daily.   Yes Historical Provider, MD    Current Facility-Administered Medications  Medication Dose Route Frequency  Provider Last Rate Last Dose  . 0.45 % sodium chloride infusion   Intravenous Continuous Ejiroghene Arlyce Dice, MD      . Derrill Memo ON 01/30/2015] cefTRIAXone (ROCEPHIN) 1 g in dextrose 5 % 50 mL IVPB - Premix  1 g Intravenous Once Ejiroghene E Emokpae, MD      . dextrose 50 % solution 50 mL  50 mL Intravenous Once Ejiroghene E Emokpae, MD      . ferrous sulfate tablet 325 mg  325 mg Oral Q breakfast Ejiroghene E Emokpae, MD      . heparin injection 5,000 Units  5,000 Units Subcutaneous 3 times per day Ejiroghene E Denton Brick, MD      . insulin aspart (novoLOG) injection 10 Units  10 Units Subcutaneous Once Ejiroghene E Emokpae, MD      . sodium polystyrene (KAYEXALATE) 15 GM/60ML suspension 15 g  15 g Oral Once Bethena Roys, MD        Allergies as of 01/29/2015  . (No Known Allergies)    No family history on file.  History   Social History  . Marital Status: Widowed    Spouse Name: N/A  . Number of Children: N/A  . Years of Education: N/A   Occupational History  . Not on file.   Social History Main Topics  . Smoking status: Never Smoker   . Smokeless tobacco: Not on file  . Alcohol Use:  No  . Drug Use: Not on file  . Sexual Activity: Not on file   Other Topics Concern  . Not on file   Social History Narrative  . No narrative on file    Review of Systems: Gen: Denies any fever, chills, sweats, + anorexia, fatigue, weakness,  HEENT: No visual complaints, No history of Retinopathy. Normal external appearance No Epistaxis or Sore throat. No sinusitis.  Mild Deafness CV: Denies chest pain, angina, palpitations, syncope, orthopnea, PND, peripheral edema, and claudication. Resp: Denies dyspnea at rest, dyspnea with exercise, cough, sputum, wheezing, coughing up blood, and pleurisy. GI: Denies vomiting blood, jaundice, and fecal incontinence.   Denies dysphagia or odynophagia. GU :  History of neobladder creation remote MS: Denies joint pain, limitation of movement, and  swelling, stiffness, low back pain, extremity pain. Denies muscle weakness, cramps, atrophy.  No use of non steroidal antiinflammatory drugs. Derm: Denies rash, itching, dry skin, hives, moles, warts, or unhealing ulcers.  Psych: Denies depression, anxiety, memory loss, suicidal ideation, hallucinations, paranoia, and confusion. Heme: Denies bruising, bleeding, and enlarged lymph nodes. Neuro: No headache.  No diplopia. No dysarthria.  No dysphasia.  No history of CVA.  No Seizures. No paresthesias.  No weakness. Endocrine No DM.  No Thyroid disease.  No Adrenal disease.  Physical Exam: Vital signs in last 24 hours: Temp:  [97.7 F (36.5 C)-97.8 F (36.6 C)] 97.8 F (36.6 C) (03/23 1639) Pulse Rate:  [81-108] 81 (03/23 1639) Resp:  [17-21] 18 (03/23 1639) BP: (112-147)/(57-91) 118/57 mmHg (03/23 1639) SpO2:  [92 %-100 %] 94 % (03/23 1639) Weight:  [66.951 kg (147 lb 9.6 oz)] 66.951 kg (147 lb 9.6 oz) (03/23 1639) Last BM Date: 01/29/15 General:   Alert,  Age appropriate appearance some obvious loss of muscle in face and hands Head:  Normocephalic and atraumatic. Eyes:  Sclera clear, no icterus.   Conjunctiva pink. Ears:  Normal auditory acuity.  Nose:  No deformity, discharge,  or lesions. Mouth:  No deformity or lesions, dentition normal. Neck:  Supple; no masses or thyromegaly. JVP not elevated Lungs:  Clear throughout to auscultation.   No wheezes, crackles, or rhonchi. No acute distress. Heart:  Regular rate and rhythm; no murmurs, clicks, rubs,  or gallops. Abdomen:  Soft, nontender and nondistended. No masses, hepatosplenomegaly or hernias noted. Normal bowel sounds, without guarding, and without rebound.   Msk:  Symmetrical without gross deformities.Marland Kitchenloss of muscle in face and hands Pulses:  No carotid, renal, femoral bruits. DP and PT symmetrical and equal Extremities:  Without clubbing or edema. Neurologic:  Alert and  oriented x4;  grossly normal neurologically. Skin:   Intact without significant lesions or rashes. Cervical Nodes:  No significant cervical adenopathy. Psych:  Alert and cooperative. Normal mood and affect.  Intake/Output from previous day:   Intake/Output this shift: Total I/O In: 550 [IV Piggyback:550] Out: 100 [Urine:100]  Lab Results:  Recent Labs  01/29/15 1124  WBC 10.6*  HGB 9.3*  HCT 27.2*  PLT 280   BMET  Recent Labs  01/29/15 1124  NA 144  K 6.1*  CL 125*  CO2 11*  GLUCOSE 114*  BUN 109*  CREATININE 5.30*  CALCIUM 9.3   LFT  Recent Labs  01/29/15 1124  PROT 7.2  ALBUMIN 3.6  AST 14  ALT 19  ALKPHOS 58  BILITOT 0.5   PT/INR No results for input(s): LABPROT, INR in the last 72 hours. Hepatitis Panel No results for input(s): HEPBSAG, HCVAB, [place], HEPBIGM  in the last 72 hours.  Studies/Results: No results found.  Assessment/Plan: Renal insufficiency: my suspicion is that this is a chronic process and the patient appears too well compensated. There may be some underlying dehydration although I am not convinced that this is clinically significant. I think it reasonable to continue the rehydration process and reassess his renal function in the morning. The underlying etiology is most likely nephrosclerosis . An SPEP and UPEP would be helpful but do not think that further work up is warrented at this stage. A renal U/S may also be helpful. Iron studies could be ordered to assess his anemia and initiate an ESA to maintain Hb > 10 PTH could also be ordered to evaluate for renal osteodystrophy  The acute situation of the metabolic acidosis and hyperkalemia can be managed by IV bicarbonate for now with recheck of renal panel later this evening  A brief discussion about dialysis was initiated and I shall follow up on this discussion when we know what sort of improvement has occurred with fluid management.  Thank you Edrick Oh   ( Nephrology )  [phone]  LOS: 0 Zenovia Justman W @TODAY @5 :02 PM

## 2015-01-30 ENCOUNTER — Inpatient Hospital Stay (HOSPITAL_COMMUNITY): Payer: Medicare Other

## 2015-01-30 DIAGNOSIS — K2270 Barrett's esophagus without dysplasia: Secondary | ICD-10-CM

## 2015-01-30 DIAGNOSIS — R8290 Unspecified abnormal findings in urine: Secondary | ICD-10-CM

## 2015-01-30 DIAGNOSIS — E872 Acidosis, unspecified: Secondary | ICD-10-CM | POA: Diagnosis present

## 2015-01-30 DIAGNOSIS — Z859 Personal history of malignant neoplasm, unspecified: Secondary | ICD-10-CM | POA: Diagnosis present

## 2015-01-30 DIAGNOSIS — E291 Testicular hypofunction: Secondary | ICD-10-CM

## 2015-01-30 DIAGNOSIS — N179 Acute kidney failure, unspecified: Secondary | ICD-10-CM | POA: Diagnosis present

## 2015-01-30 DIAGNOSIS — E860 Dehydration: Secondary | ICD-10-CM

## 2015-01-30 DIAGNOSIS — D509 Iron deficiency anemia, unspecified: Secondary | ICD-10-CM

## 2015-01-30 DIAGNOSIS — Z960 Presence of urogenital implants: Secondary | ICD-10-CM

## 2015-01-30 DIAGNOSIS — E875 Hyperkalemia: Secondary | ICD-10-CM | POA: Diagnosis present

## 2015-01-30 DIAGNOSIS — R634 Abnormal weight loss: Secondary | ICD-10-CM

## 2015-01-30 DIAGNOSIS — N390 Urinary tract infection, site not specified: Secondary | ICD-10-CM | POA: Diagnosis present

## 2015-01-30 DIAGNOSIS — Z8551 Personal history of malignant neoplasm of bladder: Secondary | ICD-10-CM

## 2015-01-30 LAB — CBC
HCT: 21.3 % — ABNORMAL LOW (ref 39.0–52.0)
HCT: 25.3 % — ABNORMAL LOW (ref 39.0–52.0)
Hemoglobin: 7.5 g/dL — ABNORMAL LOW (ref 13.0–17.0)
Hemoglobin: 8.8 g/dL — ABNORMAL LOW (ref 13.0–17.0)
MCH: 30.5 pg (ref 26.0–34.0)
MCH: 29.6 pg (ref 26.0–34.0)
MCHC: 35.2 g/dL (ref 30.0–36.0)
MCHC: 34.8 g/dL (ref 30.0–36.0)
MCV: 86.6 fL (ref 78.0–100.0)
MCV: 85.2 fL (ref 78.0–100.0)
PLATELETS: 198 10*3/uL (ref 150–400)
Platelets: 214 10*3/uL (ref 150–400)
RBC: 2.46 MIL/uL — ABNORMAL LOW (ref 4.22–5.81)
RBC: 2.97 MIL/uL — ABNORMAL LOW (ref 4.22–5.81)
RDW: 17.4 % — AB (ref 11.5–15.5)
RDW: 17.2 % — ABNORMAL HIGH (ref 11.5–15.5)
WBC: 9.8 10*3/uL (ref 4.0–10.5)
WBC: 7.5 10*3/uL (ref 4.0–10.5)

## 2015-01-30 LAB — BASIC METABOLIC PANEL
ANION GAP: 9 (ref 5–15)
ANION GAP: 7 (ref 5–15)
BUN: 106 mg/dL — ABNORMAL HIGH (ref 6–23)
BUN: 109 mg/dL — AB (ref 6–23)
CHLORIDE: 121 mmol/L — AB (ref 96–112)
CHLORIDE: 121 mmol/L — AB (ref 96–112)
CO2: 14 mmol/L — ABNORMAL LOW (ref 19–32)
CO2: 16 mmol/L — ABNORMAL LOW (ref 19–32)
CREATININE: 4.5 mg/dL — AB (ref 0.50–1.35)
CREATININE: 4.41 mg/dL — AB (ref 0.50–1.35)
Calcium: 8.3 mg/dL — ABNORMAL LOW (ref 8.4–10.5)
Calcium: 8.4 mg/dL (ref 8.4–10.5)
GFR calc Af Amer: 12 mL/min — ABNORMAL LOW (ref >90)
GFR calc Af Amer: 13 mL/min — ABNORMAL LOW (ref >90)
GFR calc non Af Amer: 11 mL/min — ABNORMAL LOW (ref >90)
GFR calc non Af Amer: 11 mL/min — ABNORMAL LOW (ref >90)
Glucose, Bld: 102 mg/dL — ABNORMAL HIGH (ref 70–99)
Glucose, Bld: 102 mg/dL — ABNORMAL HIGH (ref 70–99)
POTASSIUM: 5.1 mmol/L (ref 3.5–5.1)
Potassium: 4.7 mmol/L (ref 3.5–5.1)
Sodium: 144 mmol/L (ref 135–145)
Sodium: 144 mmol/L (ref 135–145)

## 2015-01-30 LAB — FOLATE: Folate: 12.5 ng/mL

## 2015-01-30 LAB — IRON AND TIBC
Iron: 79 ug/dL (ref 42–165)
Saturation Ratios: 44 % (ref 20–55)
TIBC: 181 ug/dL — ABNORMAL LOW (ref 215–435)
UIBC: 102 ug/dL — ABNORMAL LOW (ref 125–400)

## 2015-01-30 LAB — ABO/RH: ABO/RH(D): B POS

## 2015-01-30 LAB — SODIUM, URINE, RANDOM: Sodium, Ur: 42 mmol/L

## 2015-01-30 LAB — FERRITIN: Ferritin: 1261 ng/mL — ABNORMAL HIGH (ref 22–322)

## 2015-01-30 LAB — PREPARE RBC (CROSSMATCH)

## 2015-01-30 LAB — VITAMIN B12: VITAMIN B 12: 1126 pg/mL — AB (ref 211–911)

## 2015-01-30 LAB — CREATININE, URINE, RANDOM: CREATININE, URINE: 72.79 mg/dL

## 2015-01-30 MED ORDER — SODIUM CHLORIDE 0.9 % IV SOLN
INTRAVENOUS | Status: DC
  Administered 2015-01-30 – 2015-01-31 (×2): via INTRAVENOUS

## 2015-01-30 MED ORDER — SODIUM BICARBONATE 650 MG PO TABS
325.0000 mg | ORAL_TABLET | Freq: Two times a day (BID) | ORAL | Status: DC
Start: 2015-01-30 — End: 2015-02-01
  Administered 2015-01-30 – 2015-02-01 (×4): 325 mg via ORAL
  Filled 2015-01-30 (×7): qty 0.5

## 2015-01-30 MED ORDER — SODIUM CHLORIDE 0.9 % IV SOLN
Freq: Once | INTRAVENOUS | Status: AC
  Administered 2015-01-30: 14:00:00 via INTRAVENOUS

## 2015-01-30 NOTE — Evaluation (Signed)
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 Two weeks ago, he experienced vomiting and diarrhea that lasted for about 7 days and subsided on their own. His daughters, who check on him often, also reported that he appeared sick and thinner than usual. According to his records, he has lost 29 pounds over the past 9 months.  Clinical Impression  Patient evaluated by Physical Therapy with no further acute PT needs identified. All education has been completed and the patient has no further questions. At the time of PT eval pt was able to perform transfers and ambulation with mod I. No unsteadiness or LOB noted throughout session. Pt reports that he has not had any difficulty mobilizing around his home and tries to stay active. See below for any follow-up Physial Therapy or equipment needs. PT is signing off. Thank you for this referral.     Follow Up Recommendations No PT follow up    Equipment Recommendations  None recommended by PT    Recommendations for Other Services       Precautions / Restrictions Precautions Precautions: Fall Restrictions Weight Bearing Restrictions: No      Mobility  Bed Mobility Overal bed mobility: Modified Independent Bed Mobility: Supine to Sit           General bed mobility comments: No assist. Pt did not require use of bed rails for support - came into long sitting and then scooted around to EOB.  Transfers Overall transfer level: Modified independent Equipment used: None             General transfer comment: Pt was able to power-up to full stand with no assistance. He demonstrated proper hand placement on seated surface for  safety, and did not demonstrate any LOB.   Ambulation/Gait Ambulation/Gait assistance: Modified independent (Device/Increase time) Ambulation Distance (Feet): 480 Feet Assistive device: None Gait Pattern/deviations: Step-through pattern;Decreased stride length;Trendelenburg Gait velocity: Age-appropriate Gait velocity interpretation: at or above normal speed for age/gender General Gait Details: Pt was able to ambulate well with no noted unsteadiness or reported fatigue. Pt was able to negotiate around obstacles well in the hall.   Stairs            Wheelchair Mobility    Modified Rankin (Stroke Patients Only)       Balance Overall balance assessment: No apparent balance deficits (not formally assessed)                                           Pertinent Vitals/Pain Pain Assessment: No/denies pain    Home Living Family/patient expects to be discharged to:: Private residence Living Arrangements: Alone Available Help at Discharge: Family;Available PRN/intermittently Type of Home: House Home Access: Stairs to enter   [employer] of Steps: 2 Home Layout: Two level Home Equipment: None      Prior Function Level of Independence: Independent         Comments: Pt states he has tried to use a walker in the past and felt like he fell more with the walker than without it.  Hand Dominance   Dominant Hand: Right    Extremity/Trunk Assessment   Upper Extremity Assessment: Defer to OT evaluation;Overall WFL for tasks assessed           Lower Extremity Assessment: Overall WFL for tasks assessed      Cervical / Trunk Assessment: Normal  Communication   Communication: HOH  Cognition Arousal/Alertness: Awake/alert Behavior During Therapy: WFL for tasks assessed/performed Overall Cognitive Status: Within Functional Limits for tasks assessed                      General Comments      Exercises         Assessment/Plan    PT Assessment Patent does not need any further PT services  PT Diagnosis Generalized weakness   PT Problem List    PT Treatment Interventions     PT Goals (Current goals can be found in the Care Plan section) Acute Rehab PT Goals PT Goal Formulation: All assessment and education complete, DC therapy    Frequency     Barriers to discharge        Co-evaluation               End of Session Equipment Utilized During Treatment: Gait belt Activity Tolerance: Patient tolerated treatment well Patient left: in chair;with chair alarm set;with call bell/phone within reach Nurse Communication: Mobility status         Time: 5102-5852 PT Time Calculation (min) (ACUTE ONLY): 26 min   Charges:   PT Evaluation $Initial PT Evaluation Tier I: 1 Procedure PT Treatments $Gait Training: 8-22 mins   PT G Codes:        Rolinda Roan 13-Feb-2015, 8:49 AM  Rolinda Roan, PT, DPT Acute Rehabilitation Services Pager: [phone]

## 2015-01-30 NOTE — Progress Notes (Signed)
Active Problems:   Acute renal failure   AKI (acute kidney injury)      Code Status Orders        Start     Ordered   01/29/15 1635  Full code   Continuous     01/29/15 1642      Length of Stay (days):1   SUBJECTIVE/24 HOUR EVENTS: 81 y.o. male with PMH significant for bladder cancer and cystoprostatectomy with ileoneobladder placement, microcystic anemia from chronic iron deficiency and hypogonadism who presented yesterday at the request of his PCP after abnormal labs and his daughters who have noticed him looking sick and much thinner than normal. His daughters described that the patient had a 1 week history of vomiting and diarrhea that they thought had caused him to be very dehydrated and ill; however, on questioning to day, Mr. Heslin stated that he hadn't been vomiting and only had a little diarrhea intermixed with constipation. The patient states that he is doing great and feels fine. Continues to deny chest pain/SOB/cough/dizzyness/fever/chills/abdominal pain. U/A was positive for signs of UTI but patient denies any hematuria or burning. Was seen by nephrology. Still no clear understanding of possible acute on chronic renal failure.    OBJECTIVE: Filed Vitals:   01/29/15 1608 01/29/15 1639 01/29/15 2104 01/30/15 0541  BP:  118/57 123/47 150/68  Pulse:  81 68 85  Temp:  97.8 F (36.6 C) 97.7 F (36.5 C) 97.6 F (36.4 C)  TempSrc:  Oral Oral Oral  Resp:  18 20 18   Height: 5\' 8"  (1.727 m)  5\' 5"  (1.651 m)   Weight:  66.951 kg (147 lb 9.6 oz) 66.769 kg (147 lb 3.2 oz)   SpO2:  94% 99% 100%    Intake/Output Summary (Last 24 hours) at 01/30/15 1135 Last data filed at 01/30/15 0600  Gross per 24 hour  Intake 1996.25 ml  Output   1501 ml  Net 495.25 ml    Intake/Output last 3 shifts: I/O last 3 completed shifts: In: 1996.3 [P.O.:840; I.V.:606.3; IV Piggyback:550] Out: 1501 [Urine:1500; Stool:1]  No Known Allergies  Medications: Scheduled Meds: . sodium  chloride   Intravenous Once  . cefTRIAXone (ROCEPHIN)  IV  1 g Intravenous Once  . ferrous sulfate  325 mg Oral Q breakfast  . heparin  5,000 Units Subcutaneous 3 times per day  . sodium bicarbonate  325 mg Oral BID   Continuous Infusions: . sodium chloride 75 mL/hr at 01/30/15 3154    Physical Exam: GEN: NAD, AAOx3, WDWN, sitting in chair eating breakfast. Hearing difficulty - better on right side.  HEENT: MMM, EOMI, PERRLA, b/l sclera anicteric, no conjunctival injection, no lymphadenopathy  NECK: Supple, no JVD, no carotid bruits, no thyromegaly  CV: RRR, S1S2nl, no murmurs/rubs/gallops PULM: clear to auscultation b/l, no rales/rhonchi/wheezes, nl percussion ABD: normal/active bowel sounds, soft, ND/NT, no rebound, no guarding, no CVA tenderness, no hepatosplenomegaly. Large surgical scar down the medial abdomen - well healed. EXT: no cyanosis, clubbing or edema  GU: urethral catheter in place - insertion site clean and without irritation.  NEURO: CN II-XII intact, no focal deficits  PSYCH: nl affect, nl speech MSK: nl ROM, no joint swelling or erythema    Labs: Recent Labs     01/29/15  1124   01/30/15  0130  01/30/15  0140  01/30/15  0558  HGB  9.3*   --    --   7.5*   --   HCT  27.2*   --    --  21.3*   --   PLT  280   --    --   214   --   NA  144   < >  144   --   144  K  6.1*   < >  5.1   --   4.7  CL  125*   < >  121*   --   121*  CO2  11*   < >  14*   --   16*  BUN  109*   < >  106*   --   109*  CREATININE  5.30*   < >  4.50*   --   4.41*  CALCIUM  9.3   < >  8.3*   --   8.4   < > = values in this interval not displayed.    CBC Latest Ref Rng 01/30/2015 01/29/2015  WBC 4.0 - 10.5 K/uL 9.8 10.6(H)  Hemoglobin 13.0 - 17.0 g/dL 7.5(L) 9.3(L)  Hematocrit 39.0 - 52.0 % 21.3(L) 27.2(L)  Platelets 150 - 400 K/uL 214 280   Type and Screen in progress for transfusion.   BMP Latest Ref Rng 01/30/2015 01/30/2015 01/29/2015  Glucose 70 - 99 mg/dL 102(H) 102(H) 72  BUN  6 - 23 mg/dL 109(H) 106(H) 110(H)  Creatinine 0.50 - 1.35 mg/dL 4.41(H) 4.50(H) 4.80(H)  Sodium 135 - 145 mmol/L 144 144 144  Potassium 3.5 - 5.1 mmol/L 4.7 5.1 5.0  Chloride 96 - 112 mmol/L 121(H) 121(H) 123(H)  CO2 19 - 32 mmol/L 16(L) 14(L) 12(L)  Calcium 8.4 - 10.5 mg/dL 8.4 8.3(L) 8.7    CMP Latest Ref Rng 01/30/2015 01/30/2015 01/29/2015  Glucose 70 - 99 mg/dL 102(H) 102(H) 72  BUN 6 - 23 mg/dL 109(H) 106(H) 110(H)  Creatinine 0.50 - 1.35 mg/dL 4.41(H) 4.50(H) 4.80(H)  Sodium 135 - 145 mmol/L 144 144 144  Potassium 3.5 - 5.1 mmol/L 4.7 5.1 5.0  Chloride 96 - 112 mmol/L 121(H) 121(H) 123(H)  CO2 19 - 32 mmol/L 16(L) 14(L) 12(L)  Calcium 8.4 - 10.5 mg/dL 8.4 8.3(L) 8.7  Total Protein 6.0 - 8.3 g/dL - - -  Total Bilirubin 0.3 - 1.2 mg/dL - - -  Alkaline Phos 39 - 117 U/L - - -  AST 0 - 37 U/L - - -  ALT 0 - 53 U/L - - -   --/--/B POS, B POS (03/24 0825)   Urinalysis    Component Value Date/Time   COLORURINE YELLOW 01/29/2015 1207   APPEARANCEUR TURBID* 01/29/2015 1207   LABSPEC 1.012 01/29/2015 1207   PHURINE 6.5 01/29/2015 1207   GLUCOSEU NEGATIVE 01/29/2015 1207   HGBUR MODERATE* 01/29/2015 [place] 01/29/2015 1207   KETONESUR NEGATIVE 01/29/2015 1207   PROTEINUR 100* 01/29/2015 1207   UROBILINOGEN 0.2 01/29/2015 1207   NITRITE NEGATIVE 01/29/2015 1207   LEUKOCYTESUR LARGE* 01/29/2015 1207   IFE and urine culture in progress.    Images:  Renal Ultrasound (01/29/2015): moderate left hydronephrosis with thinning of the renal parenchyma (similar to prior CT). No right hydronephrosis but mild thinning of the right renal parenchyma and heterogenicity suggesting chronic medical renal disease.   Chest X-ray: read pending but there appears to be abnormality.   Medications, Vitals, Labs, and Images reviewed.   ASSESSMENT AND PLAN: 81 y/o male with PMH significant for bladder cancer and cystoprostatectomy with ileoneobladder placement, microcystic  anemia from chronic iron deficiency and hypogonadism found to have possible acute on chronic renal  failure/injury and UTI (possible that U/A abnormal due to chronic self caths with reused cathaters) in the setting of weight loss, electrolyte abnormalities and anemia. Imaging revealed left hydroureteronephrosis. Dehydration questionable as the underlying cause of his metabolic acidosis and acute kidney injury due to his self-reported history of no significant vomiting or diarrhea and unremarkable physical exam. Patient does not appear uremic.   Acute on Chronic Renal Failure: Patient has abnormal electrolytes and anemia per lab results with metabolic acidosis and increased creatinine and BUN. Since being treated with bicarbonate in dextrose, insulin, kayexelate and then sodium bicarbonate in sterile water his potassium (down to 5.1 from 6.1)  and bicarb (up to 14 from 11) are normalizing. His creatinine has also improved (down to 4.5 from 5.3 on admission). His last creatinine from 2013 was 2.7; therefore, this is likely an acute on chronic issue. Renal ultrasound showed left hydroureteronephrosis and U/A showed UTI which could be due to him self-catheterizing with used catheters instead of new ones each time. No new medications or known illnesses and no chronic NSAID use. No uncontrolled HTN. Elevated creatinine is probably due to progression of CKD. Nephrology consulted suggested nephrosclerosis, of which focal segmental glomerulonephritis could be included (less likely given his lack of many RBCs or excessive protein on his U/A). Patient denied dialysis and would prefer palliative care for renal issues; therefore, while a biopsy could help to diagnosis, it wouldn't change management and should therefore not be performed at this time.  - appreciate nephrology consult and recommendations - continue to trent Bmet  - switched to IV NS 42mL/hr - PTH, SPEP and UPEP pending   UTI: UA revealed large leukocytes,  moderate hemoglobin, many WBC. There was hemoglobin in his urine, but he self-catheterizes so this could be artifact.  - Ceftriaxone - Urine culture pending  Anemia: Patient's hemoglobin continues to trend downward - now at 7.5 (was 10.5 in February and 9.3 on admission yesterday). He has had to have a transfusion in the past when hemoglobin reached 5.5. Most likely due to kidney failure causing decreased production of erythropoietin. Could also be a component of dilution. MCV showing normocytic anemia.  - Continue to trend hemoglobin/hematocrit  - type and screen pending  - 1 unit of PRBC to be given today after type and screen - follow up with labs after.  - continue home ferrous sulfate tablet daily   Weight Loss: Last recorded weight 06/2014 = 176lbs. Patient now weighs 147lbs. He and his daughters stated that he hasn't been eating as much; however, weight loss in the setting of his history of bladder cancer and Barrett's esophagus is concerning. His last colonoscopy on our record was 2005 with noted diverticulosis and internal hemorrhoids.  - Read on Chest x ray pending but enlarged aortic arch noted  History of Bladder Cancer and Barritt's Esophagus: followed by Dr. Gaynelle Arabian and Dr. Eustaquio Boyden  - Continue home Nexium 40mg  daily   Hypogonadism: - continue home Axiron for hormone replacement. Testosterone levels have been within normal limits.   Diet: Regular  DVT prophylaxis: Heparin 5,000 units [place] q8hr

## 2015-01-30 NOTE — Progress Notes (Signed)
[facility] ROUNDING NOTE   Subjective:   Interval History: bright and alert this morning and in good spirits  Objective:  Vital signs in last 24 hours:  Temp:  [97.6 F (36.4 C)-97.8 F (36.6 C)] 97.6 F (36.4 C) (03/24 0541) Pulse Rate:  [68-108] 85 (03/24 0541) Resp:  [17-21] 18 (03/24 0541) BP: (112-150)/(47-91) 150/68 mmHg (03/24 0541) SpO2:  [92 %-100 %] 100 % (03/24 0541) Weight:  [66.769 kg (147 lb 3.2 oz)-66.951 kg (147 lb 9.6 oz)] 66.769 kg (147 lb 3.2 oz) (03/23 2104)  Weight change:  Filed Weights   01/29/15 1639 01/29/15 2104  Weight: 66.951 kg (147 lb 9.6 oz) 66.769 kg (147 lb 3.2 oz)    Intake/Output: I/O last 3 completed shifts: In: 1996.3 [P.O.:840; I.V.:606.3; IV Piggyback:550] Out: 1501 [Urine:1500; Stool:1]   Intake/Output this shift:     CVS- RRR RS- CTA ABD- BS present soft non-distended EXT- no edema   Basic Metabolic Panel:  Recent Labs Lab 01/29/15 1124 01/29/15 1928 01/30/15 0130 01/30/15 0558  NA 144 144 144 144  K 6.1* 5.0 5.1 4.7  CL 125* 123* 121* 121*  CO2 11* 12* 14* 16*  GLUCOSE 114* 72 102* 102*  BUN 109* 110* 106* 109*  CREATININE 5.30* 4.80* 4.50* 4.41*  CALCIUM 9.3 8.7 8.3* 8.4    Liver Function Tests:  Recent Labs Lab 01/29/15 1124  AST 14  ALT 19  ALKPHOS 58  BILITOT 0.5  PROT 7.2  ALBUMIN 3.6   No results for input(s): LIPASE, AMYLASE in the last 168 hours. No results for input(s): AMMONIA in the last 168 hours.  CBC:  Recent Labs Lab 01/29/15 1124 01/30/15 0140  WBC 10.6* 9.8  NEUTROABS 7.9*  --   HGB 9.3* 7.5*  HCT 27.2* 21.3*  MCV 87.5 86.6  PLT 280 214    Cardiac Enzymes:  Recent Labs Lab 01/29/15 1928  CKTOTAL 47    BNP: Invalid input(s): POCBNP  CBG: No results for input(s): GLUCAP in the last 168 hours.  Microbiology: No results found for this or any previous visit.  Coagulation Studies: No results for input(s): LABPROT, INR in the last 72  hours.  Urinalysis:  Recent Labs  01/29/15 1207  COLORURINE YELLOW  LABSPEC 1.012  PHURINE 6.5  GLUCOSEU NEGATIVE  HGBUR MODERATE*  BILIRUBINUR NEGATIVE  KETONESUR NEGATIVE  PROTEINUR 100*  UROBILINOGEN 0.2  NITRITE NEGATIVE  LEUKOCYTESUR LARGE*      Imaging: US Renal  01/30/2015   CLINICAL DATA:  Acute kidney injury.  History of bladder cancer.  EXAM: RENAL/URINARY TRACT ULTRASOUND COMPLETE  COMPARISON:  CT 08/28/2012  FINDINGS: Right Kidney:  Length: 11.1 cm. There is mild thinning of the renal parenchyma and heterogeneous echogenicity. No mass or hydronephrosis visualized.  Left Kidney:  Length: 10.4 cm. There is moderate left hydroureteronephrosis. Left renal pelvis measures 2.2 cm. Proximal ureter measures 15 mm, the mid ureter measures 18 mm. There is thinning of the left renal parenchyma  Bladder:  Urinary bladder is decompressed by Foley catheter and not evaluated.  IMPRESSION: 1. Chronic left hydroureteronephrosis, this appears similar to prior CT. 2. No right hydronephrosis. There is mild thinning of the right renal parenchyma and heterogeneous echogenicity suggesting chronic medical renal disease.   Electronically Signed   By: Jeb Levering M.D.   On: 01/30/2015 03:19     Medications:   .  sodium bicarbonate 150 mEq in sterile water 1000 mL infusion 125 mL/hr at 01/29/15 2109   . cefTRIAXone (  ROCEPHIN)  IV  1 g Intravenous Once  . ferrous sulfate  325 mg Oral Q breakfast  . heparin  5,000 Units Subcutaneous 3 times per day     Assessment/ Plan:   Renal failure  Reviewed U/S  Chronic unilateral hydronephrosis Left. My suspicion is chronic kidney disease. We had a very frank discussion this morning about goals of care and agree that dialysis would not be an optimal treatment. He is leaning to a more palliative approach. He has a long time relationship with Dr Gaynelle Arabian and I would recommend that he is informed that Mr Sarr is in the hospital. There are 3  daughters that help and are also involved in Mr Trim' care.  Metabolic acidosis  Continue IV bicarbonate  Anemia consider transfusion and start ESA  Bones  Evaluate PTH     LOS: 1 Rodney Obrien W @TODAY @7 :46 AM

## 2015-01-30 NOTE — Progress Notes (Addendum)
Subjective: Rodney Obrien is feeling "great" this morning. He has no complaints.   Interval Events:  - Called Dr. Gaynelle Arabian to make aware of this visit; office took message, but he is on vacation this week - No diarrhea or vomiting in the hospital  Objective: Vital signs in last 24 hours: Filed Vitals:   01/29/15 1608 01/29/15 1639 01/29/15 2104 01/30/15 0541  BP:  118/57 123/47 150/68  Pulse:  81 68 85  Temp:  97.8 F (36.6 C) 97.7 F (36.5 C) 97.6 F (36.4 C)  TempSrc:  Oral Oral Oral  Resp:  18 20 18   Height: 5\' 8"  (1.727 m)  5\' 5"  (1.651 m)   Weight:  147 lb 9.6 oz (66.951 kg) 147 lb 3.2 oz (66.769 kg)   SpO2:  94% 99% 100%   Weight change:   Intake/Output Summary (Last 24 hours) at 01/30/15 8657 Last data filed at 01/30/15 0600  Gross per 24 hour  Intake 1996.25 ml  Output   1501 ml  Net 495.25 ml    Physical Exam: Appearance: in NAD, sitting up in chair eating breakfast, hard of hearing (R side better than left) HEENT: AT/[place], PERRL, EOMi, no lymphadenopathy, MMM Heart: RRR, normal S1S2, no murmurs Lungs: CTAB, no wheezes Abdomen: BS+, soft, nontender, large vertical surgical scar that is well-healed GU:  Musculoskeletal: normal ROM, multiple DIP joints enlarged and deformed, nontender  Extremities: no edema Neurologic: A&Ox3, grossly intact Skin: no rashes or lesions  Lab Results: Basic Metabolic Panel:  Recent Labs Lab 01/29/15 1928 01/30/15 0130  NA 144 144  K 5.0 5.1  CL 123* 121*  CO2 12* 14*  GLUCOSE 72 102*  BUN 110* 106*  CREATININE 4.80* 4.50*  CALCIUM 8.7 8.3*   Liver Function Tests:  Recent Labs Lab 01/29/15 1124  AST 14  ALT 19  ALKPHOS 58  BILITOT 0.5  PROT 7.2  ALBUMIN 3.6   CBC:  Recent Labs Lab 01/29/15 1124 01/30/15 0140  WBC 10.6* 9.8  NEUTROABS 7.9*  --   HGB 9.3* 7.5*  HCT 27.2* 21.3*  MCV 87.5 86.6  PLT 280 214   Cardiac Enzymes:  Recent Labs Lab 01/29/15 1928  CKTOTAL 47   Anemia  Panel:  Recent Labs Lab 01/29/15 1928  VITAMINB12 1126*  FOLATE 12.5  FERRITIN 1261*  TIBC 181*  IRON 79  RETICCTPCT 0.7   Urinalysis:  Recent Labs Lab 01/29/15 1207  COLORURINE YELLOW  LABSPEC 1.012  PHURINE 6.5  GLUCOSEU NEGATIVE  HGBUR MODERATE*  BILIRUBINUR NEGATIVE  KETONESUR NEGATIVE  PROTEINUR 100*  UROBILINOGEN 0.2  NITRITE NEGATIVE  LEUKOCYTESUR LARGE*    Studies/Results: US Renal  01/30/2015   CLINICAL DATA:  Acute kidney injury.  History of bladder cancer.  EXAM: RENAL/URINARY TRACT ULTRASOUND COMPLETE  COMPARISON:  CT 08/28/2012  FINDINGS: Right Kidney:  Length: 11.1 cm. There is mild thinning of the renal parenchyma and heterogeneous echogenicity. No mass or hydronephrosis visualized.  Left Kidney:  Length: 10.4 cm. There is moderate left hydroureteronephrosis. Left renal pelvis measures 2.2 cm. Proximal ureter measures 15 mm, the mid ureter measures 18 mm. There is thinning of the left renal parenchyma  Bladder:  Urinary bladder is decompressed by Foley catheter and not evaluated.  IMPRESSION: 1. Chronic left hydroureteronephrosis, this appears similar to prior CT. 2. No right hydronephrosis. There is mild thinning of the right renal parenchyma and heterogeneous echogenicity suggesting chronic medical renal disease.   Electronically Signed   By: Fonnie Birkenhead.D.  On: 01/30/2015 03:19   Medications: I have reviewed the patient's current medications. Scheduled Meds: . cefTRIAXone (ROCEPHIN)  IV  1 g Intravenous Once  . ferrous sulfate  325 mg Oral Q breakfast  . heparin  5,000 Units Subcutaneous 3 times per day   Continuous Infusions: .  sodium bicarbonate 150 mEq in sterile water 1000 mL infusion 125 mL/hr at 01/29/15 2109   PRN Meds:. Assessment/Plan: Active Problems:   Acute renal failure   AKI (acute kidney injury)  Rodney Obrien is an 81 yo man with a history of bladder cancer for which he underwent cystoprostatectomy with ileoneobladder  placement who self-catheterizes at home. He has been found to have AKI, possible CKD, left hydroureteronephrosis, a urinary tract infection, acute on chronic anemia and a significant recent weight loss.   Acute on Chronic Renal Failure: Found on admission to be hyperkalemic, acidotic and with a high creatinine. There were no changes on EKG and the patient was treated with bicarbonate in dextrose, insulin, kayexelate and then sodium bicarbonate in sterile water at the suggestion of nephrology. These numbers are normalizing (K 6.1-->5.1, CO2 11-->14 and Cr 5.3-->4.5). He has some proteinuria and signs of infection on his UA. He appeared dry on initial exam, but now more well-hydrated. His last known creatinine is 1.7 in 2013 (from Dr. Arlyn Leak urology office), so this is likely an acute on chronic problem. GI office (who he considers his PCP) has no creatinine on file. His renal ultrasound demonstrated chronic (unchanged) left hydroureteronephrosis (dilation of the ureter, renal pelvis and calices). This patient self-catheterizes due to his ileoneobladder. He is not a chronic NSAID user. He has no known history of lupus or vasculitis. The most likely explanation for the patient's creatinine is a gradual progression of CKD that can be traced back the slightly elevated creatinine in 2013; the most likely etiology is nephrosclerosis, though glomerulonephritis is also on the differential (less likely given his lack of many RBCs or excessive protein on his UA). Given that the patient would like palliative management of his renal issues (as opposed to HD), a biopsy would be unlikely to be appropriate at this time.  - Continue to trend BMET - Switch to IV NS at 75 mL/hr today - PTH, SPEP and UPEP pending  Urinary Tract Infection: UA revealed large leukocytes, moderate hemoglobin, many WBC. There was hemoglobin in his urine, but this is to be expected in someone who self-catheterizes. WBC 10.6-->9.8. - Ceftriaxone  per pharmacy - Urine culture pending  Normocytic Anemia: Has required transfusion in the past when his labs came back with a hemoglobin ~5.5 at an outpatient visit. His records refer to an iron deficiency anemia and he is on ferrous sulfate at home. His baseline hemoglobin is 9-10. The anemia is likely due to his renal insufficiency. On this admission, hemoglobin is 9.3-->7.5. - 1 U PRBC given today; follow up post-transfusion CBC - Continue to trend - Continue ferrous sulfate tablet 325 mg daily (home medication)   Weight Loss: 06/2014 he was 176 lbs-->147 lbs. Does admit to decreased oral intake over last 2 weeks. Concerning with history of bladder cancer and Barrett's esophagus. - CXR pending - Attempting to find more records; GI office sending over most recent reports  History of High Grade Bladder Cancer: Patient underwent cystoprostatectomy and now self-catheterizes. His significant weight loss is concerning for recurrence. He follows with Dr. Gaynelle Arabian (and previously followed with Dr. Hinton Rao, oncology).  Barrett's Esophagus: Patient is managed by Dr. Eustaquio Boyden (GI)  and takes Nexium 40 mg daily.  Hypogonadism: Receives hormone replacement therapy (testosterone). On Axiron at home.  Diet: Regular  DVT Ppx: heparin [place]  Dispo: Disposition is deferred at this time, awaiting improvement of current medical problems.  Anticipated discharge in approximately 2 day(s).   The patient does not have a current PCP (Dr. Lyda Jester, GI) and does not need an [facility] hospital follow-up appointment after discharge.  The patient does not know have transportation limitations that hinder transportation to clinic appointments.  .Services Needed at time of discharge: Y = Yes, Blank = No PT:   OT:   RN:   Equipment:   Other:     LOS: 1 day   Karlene Einstein, MD 01/30/2015, 6:32 AM

## 2015-01-31 ENCOUNTER — Inpatient Hospital Stay (HOSPITAL_COMMUNITY): Payer: Medicare Other

## 2015-01-31 DIAGNOSIS — N189 Chronic kidney disease, unspecified: Secondary | ICD-10-CM | POA: Diagnosis present

## 2015-01-31 DIAGNOSIS — N390 Urinary tract infection, site not specified: Secondary | ICD-10-CM

## 2015-01-31 DIAGNOSIS — E860 Dehydration: Secondary | ICD-10-CM | POA: Diagnosis present

## 2015-01-31 DIAGNOSIS — C679 Malignant neoplasm of bladder, unspecified: Secondary | ICD-10-CM | POA: Diagnosis present

## 2015-01-31 DIAGNOSIS — Z66 Do not resuscitate: Secondary | ICD-10-CM | POA: Diagnosis not present

## 2015-01-31 DIAGNOSIS — R634 Abnormal weight loss: Secondary | ICD-10-CM | POA: Diagnosis present

## 2015-01-31 DIAGNOSIS — N179 Acute kidney failure, unspecified: Secondary | ICD-10-CM | POA: Diagnosis present

## 2015-01-31 DIAGNOSIS — D649 Anemia, unspecified: Secondary | ICD-10-CM | POA: Diagnosis present

## 2015-01-31 DIAGNOSIS — R64 Cachexia: Secondary | ICD-10-CM

## 2015-01-31 DIAGNOSIS — Z515 Encounter for palliative care: Secondary | ICD-10-CM

## 2015-01-31 LAB — BASIC METABOLIC PANEL
Anion gap: 7 (ref 5–15)
BUN: 81 mg/dL — AB (ref 6–23)
CHLORIDE: 122 mmol/L — AB (ref 96–112)
CO2: 18 mmol/L — ABNORMAL LOW (ref 19–32)
CREATININE: 3.93 mg/dL — AB (ref 0.50–1.35)
Calcium: 8 mg/dL — ABNORMAL LOW (ref 8.4–10.5)
GFR calc non Af Amer: 13 mL/min — ABNORMAL LOW (ref >90)
GFR, EST AFRICAN AMERICAN: 15 mL/min — AB (ref >90)
GLUCOSE: 101 mg/dL — AB (ref 70–99)
Potassium: 4.3 mmol/L (ref 3.5–5.1)
Sodium: 147 mmol/L — ABNORMAL HIGH (ref 135–145)

## 2015-01-31 LAB — TYPE AND SCREEN
ABO/RH(D): B POS
Antibody Screen: NEGATIVE
UNIT DIVISION: 0

## 2015-01-31 LAB — PROTEIN ELECTROPHORESIS, SERUM
A/G Ratio: 1.1 (ref 0.7–2.0)
Albumin ELP: 3.4 g/dL (ref 3.2–5.6)
Alpha-1-Globulin: 0.2 g/dL (ref 0.1–0.4)
Alpha-2-Globulin: 0.6 g/dL (ref 0.4–1.2)
Beta Globulin: 0.8 g/dL (ref 0.6–1.3)
GAMMA GLOBULIN: 1.3 g/dL (ref 0.5–1.6)
GLOBULIN, TOTAL: 3 g/dL (ref 2.0–4.5)
Total Protein ELP: 6.4 g/dL (ref 6.0–8.5)

## 2015-01-31 LAB — PARATHYROID HORMONE, INTACT (NO CA): PTH: 73 pg/mL — AB (ref 15–65)

## 2015-01-31 LAB — CBC
HEMATOCRIT: 23.6 % — AB (ref 39.0–52.0)
Hemoglobin: 8.2 g/dL — ABNORMAL LOW (ref 13.0–17.0)
MCH: 29.6 pg (ref 26.0–34.0)
MCHC: 34.7 g/dL (ref 30.0–36.0)
MCV: 85.2 fL (ref 78.0–100.0)
PLATELETS: 192 10*3/uL (ref 150–400)
RBC: 2.77 MIL/uL — ABNORMAL LOW (ref 4.22–5.81)
RDW: 18 % — AB (ref 11.5–15.5)
WBC: 6.9 10*3/uL (ref 4.0–10.5)

## 2015-01-31 LAB — HEMOGLOBIN A1C
Hgb A1c MFr Bld: 5.7 % — ABNORMAL HIGH (ref 4.8–5.6)
MEAN PLASMA GLUCOSE: 117 mg/dL

## 2015-01-31 MED ORDER — SACCHAROMYCES BOULARDII 250 MG PO CAPS
250.0000 mg | ORAL_CAPSULE | Freq: Two times a day (BID) | ORAL | Status: DC
  Administered 2015-01-31 – 2015-02-01 (×2): 250 mg via ORAL
  Filled 2015-01-31 (×4): qty 1

## 2015-01-31 MED ORDER — SODIUM CHLORIDE 0.45 % IV SOLN
INTRAVENOUS | Status: DC
  Administered 2015-01-31: 11:00:00 via INTRAVENOUS

## 2015-01-31 MED ORDER — SODIUM BICARBONATE 325 MG PO TABS: 325.0000 mg | ORAL_TABLET | Freq: Two times a day (BID) | ORAL | Status: DC

## 2015-01-31 MED ORDER — SODIUM CHLORIDE 0.9 % IV BOLUS (SEPSIS)
500.0000 mL | Freq: Once | INTRAVENOUS | Status: AC
Start: 2015-01-31 — End: 2015-01-31
  Administered 2015-01-31: 500 mL via INTRAVENOUS

## 2015-01-31 MED ORDER — SENNA 8.6 MG PO TABS
1.0000 | ORAL_TABLET | Freq: Every evening | ORAL | Status: DC | PRN
  Filled 2015-01-31: qty 1

## 2015-01-31 MED ORDER — IOHEXOL 300 MG/ML  SOLN
25.0000 mL | INTRAMUSCULAR | Status: AC
  Administered 2015-01-31 (×2): 25 mL via ORAL
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
## 2015-01-31 NOTE — Progress Notes (Addendum)
Subjective: Rodney Obrien is feeling "very well" this morning.  Interval Events:  - No diarrhea or vomiting in the hospital - [facility] will be calling patient to set up an appointment in the next 2 weeks - Palliative team to meet with patient and his step-daughter today at 2:30PM - Likely discharge tomorrow (3/26), as family is equipped to take him then, and Palliative / follow-up plans can be in place  Objective: Vital signs in last 24 hours: Filed Vitals:   01/30/15 1621 01/30/15 2159 01/31/15 0415 01/31/15 0829  BP: 146/74 151/59 143/68 139/72  Pulse: 93 79 75 80  Temp: 98.4 F (36.9 C) 97.5 F (36.4 C) 97.6 F (36.4 C) 97.9 F (36.6 C)  TempSrc: Oral Oral Oral Oral  Resp: 18 20 18 18   Height:      Weight:  153 lb 4.8 oz (69.536 kg)    SpO2: 100% 96% 100% 100%   Weight change: 5 lb 11.2 oz (2.586 kg)  Intake/Output Summary (Last 24 hours) at 01/31/15 1058 Last data filed at 01/31/15 0932  Gross per 24 hour  Intake 2383.75 ml  Output   1950 ml  Net 433.75 ml    Physical Exam: Appearance: in NAD, sitting up in chair, hard of hearing (R side better than left) HEENT: AT/[place], PERRL, EOMi, no lymphadenopathy, MMM Heart: RRR, normal S1S2, no murmurs Lungs: CTAB, no wheezes Abdomen: BS+, soft, nontender, large vertical surgical scar that is well-healed GU: catheter in place Musculoskeletal: normal ROM, multiple DIP joints enlarged and deformed, nontender  Extremities: no edema Neurologic: A&Ox3, grossly intact Skin: no rashes or lesions  Lab Results: Basic Metabolic Panel:  Recent Labs Lab 01/30/15 0558 01/31/15 0720  NA 144 147*  K 4.7 4.3  CL 121* 122*  CO2 16* 18*  GLUCOSE 102* 101*  BUN 109* 81*  CREATININE 4.41* 3.93*  CALCIUM 8.4 8.0*   Liver Function Tests:  Recent Labs Lab 01/29/15 1124  AST 14  ALT 19  ALKPHOS 58  BILITOT 0.5  PROT 7.2  ALBUMIN 3.6   CBC:  Recent Labs Lab 01/29/15 1124  01/30/15 1843 01/31/15 0720  WBC 10.6*  < > 7.5  6.9  NEUTROABS 7.9*  --   --   --   HGB 9.3*  < > 8.8* 8.2*  HCT 27.2*  < > 25.3* 23.6*  MCV 87.5  < > 85.2 85.2  PLT 280  < > 198 192  < > = values in this interval not displayed. Cardiac Enzymes:  Recent Labs Lab 01/29/15 1928  CKTOTAL 47   Anemia Panel:  Recent Labs Lab 01/29/15 1928  VITAMINB12 1126*  FOLATE 12.5  FERRITIN 1261*  TIBC 181*  IRON 79  RETICCTPCT 0.7   Urinalysis:  Recent Labs Lab 01/29/15 1207  COLORURINE YELLOW  LABSPEC 1.012  PHURINE 6.5  GLUCOSEU NEGATIVE  HGBUR MODERATE*  BILIRUBINUR NEGATIVE  KETONESUR NEGATIVE  PROTEINUR 100*  UROBILINOGEN 0.2  NITRITE NEGATIVE  LEUKOCYTESUR LARGE*    Studies/Results: Dg Chest 2 View  01/30/2015   CLINICAL DATA:  History of bladder malignancy with significant weight loss over the past 9 months  EXAM: CHEST  2 VIEW  COMPARISON:  Portable chest x-ray of Mar 15, 2012  FINDINGS: The lungs are adequately inflated. There is no focal infiltrate. There is no pulmonary parenchymal mass. Nipple shadows are visible projecting over the hemidiaphragms. The heart and pulmonary vascularity are normal. There is tortuosity of the descending thoracic aorta. The bony thorax is unremarkable.  IMPRESSION: There is no evidence of metastatic disease nor other active cardiopulmonary disease.   Electronically Signed   By: David  [place]   On: 01/30/2015 13:32   US Renal  01/30/2015   CLINICAL DATA:  Acute kidney injury.  History of bladder cancer.  EXAM: RENAL/URINARY TRACT ULTRASOUND COMPLETE  COMPARISON:  CT 08/28/2012  FINDINGS: Right Kidney:  Length: 11.1 cm. There is mild thinning of the renal parenchyma and heterogeneous echogenicity. No mass or hydronephrosis visualized.  Left Kidney:  Length: 10.4 cm. There is moderate left hydroureteronephrosis. Left renal pelvis measures 2.2 cm. Proximal ureter measures 15 mm, the mid ureter measures 18 mm. There is thinning of the left renal parenchyma  Bladder:  Urinary bladder is  decompressed by Foley catheter and not evaluated.  IMPRESSION: 1. Chronic left hydroureteronephrosis, this appears similar to prior CT. 2. No right hydronephrosis. There is mild thinning of the right renal parenchyma and heterogeneous echogenicity suggesting chronic medical renal disease.   Electronically Signed   By: Jeb Levering M.D.   On: 01/30/2015 03:19   Medications: I have reviewed the patient's current medications. Scheduled Meds: . ferrous sulfate  325 mg Oral Q breakfast  . heparin  5,000 Units Subcutaneous 3 times per day  . sodium bicarbonate  325 mg Oral BID   Continuous Infusions: . sodium chloride 75 mL/hr at 01/31/15 1040   PRN Meds:. Assessment/Plan: Active Problems:   Acute renal failure   AKI (acute kidney injury)   Acute renal failure syndrome   History of cancer   Hyperkalemia   Urinary tract infectious disease   Metabolic acidosis  Rodney Obrien is an 81 yo man with a history of bladder cancer for which he underwent cystoprostatectomy with ileal neobladder placement who self-catheterizes at home. He has been found to have chronic left hydronephrosis and a urinary tract infection in the setting of what appears to be chronic renal failure secondary to chronic reflux following his urological reconstruction. He is not interested in pursuing dialysis or other invasive measures. He also has been found to have acute on chronic anemia and a significant recent weight loss.   Acute on Chronic Renal Failure: Found on admission to be hyperkalemic, acidotic and with a high creatinine; these values are all trending toward normal after treatment with bicarbonate in dextrose, insulin, kayexelate, then sodium bicarbonate in sterile water. There were never changes on EKG. He is now on half normal saline at 75 mL/hr. K 6.1-->4.3,  CO2 11-->18.  Cr 5.3-->3.93. He had some proteinuria and signs of infection on his UA and appeared dry on initial exam; he is now well-hydrated. Renal  ultrasound demonstrated chronic (unchanged) left hydroureteronephrosis (dilation of the ureter, renal pelvis and calices). This patient self-catheterizes due to his ileal neobladder. He is not a chronic NSAID user. He has no known history of lupus or vasculitis. The most likely explanation for the patient's creatinine is a gradual progression of CKD that can be traced back the slightly elevated creatinine in 2013 (1.7 per Dr. Arlyn Leak records); the most likely etiology is chronic reflux following his urological reconstruction, though nephrosclerosis and glomerulonephritis are also on the differential (less likely given his lack of many RBCs or excessive protein on his UA). Given that the patient would like palliative management of his renal issues (as opposed to HD), a biopsy would be unlikely to be appropriate at this time. PTH slightly elevated at 73.  - Switch to IV 1/2NS 75 mL/hr - SPEP and UPEP pending -  Given that patient would like a palliative approach his CKD, family to meet with palliative care today  Urinary Tract Infection: UA revealed large leukocytes, moderate hemoglobin, many WBC. There was hemoglobin in his urine, but this is to be expected in someone who self-catheterizes. Urine culture with gram negative rods. WBC 10.6-->9.8-->6.9. - Ceftriaxone per pharmacy completed  Normocytic Anemia: Has required transfusion in the past when his labs came back with a hemoglobin ~5.5 at an outpatient visit. His records refer to an iron deficiency anemia and he is on ferrous sulfate at home. His baseline hemoglobin is 9-10. The anemia is likely due to his renal insufficiency. On this admission, hemoglobin is 9.3-->7.5-->8.2. - 1 U PRBC given yesterday - Continue to trend - Continue ferrous sulfate tablet 325 mg daily (home medication)   Weight Loss: 06/2014 he was 176 lbs-->147-->153 lbs (today). Does admit to decreased oral intake over last 2 weeks. Concerning with history of bladder cancer and  Barrett's esophagus. - CXR pending - Followed closely by GI and urology; needs PCP  History of High Grade Bladder Cancer: Patient underwent cystoprostatectomy and now self-catheterizes. His significant weight loss is concerning for recurrence. He follows with Dr. Gaynelle Arabian (and previously followed with Dr. Hinton Rao, oncology).  Barrett's Esophagus: Patient is managed by Dr. Lyda Jester (GI) and takes Nexium 40 mg daily.  Hypogonadism: Receives hormone replacement therapy (testosterone). On Axiron at home.  Diet: Regular  DVT Ppx: heparin [place]  Dispo: Disposition is deferred at this time, awaiting improvement of current medical problems.  Anticipated discharge in approximately 1 day(s).   The patient does not have a current PCP (has used Dr. Lyda Jester, GI) and does need an [facility] hospital follow-up appointment after discharge.  The patient does not know have transportation limitations that hinder transportation to clinic appointments.  .Services Needed at time of discharge: Y = Yes, Blank = No PT:   OT:   RN:   Equipment:   Other:     LOS: 2 days   Karlene Einstein, MD 01/31/2015, 10:58 AM

## 2015-01-31 NOTE — Progress Notes (Signed)
Internal Medicine Attending  Date: 01/31/2015  Patient name: Rodney Obrien Medical record number: [id number] Date of birth: 1926/06/20 Age: 81 y.o. Gender: male  I saw and evaluated the patient. I reviewed the resident's note by Dr. Sherrine Maples and I agree with the resident's findings and plans as documented in her progress note.  [name] was without complaints when seen on rounds this AM.  He denied nausea and stated his appetite may be improving.  He looked well and was very interactive.  Cardiac examination was w/o rubs.  Creatinine now less than 4 and potassium is well within the normal range.  He remains anemic and this is likely secondary to anemia of chronic kidney disease.  He and his family will speak to palliative care today to codify goals of care but I suspect he will be stable for discharge later today.  We will try to arrange Primary Care follow-up in the [facility].

## 2015-01-31 NOTE — Progress Notes (Signed)
[facility] ROUNDING NOTE   Subjective:   Interval History:  No complaints feels better   Objective:  Vital signs in last 24 hours:  Temp:  [97.5 F (36.4 C)-98.4 F (36.9 C)] 97.9 F (36.6 C) (03/25 0829) Pulse Rate:  [75-93] 80 (03/25 0829) Resp:  [18-20] 18 (03/25 0829) BP: (133-152)/(59-74) 139/72 mmHg (03/25 0829) SpO2:  [96 %-100 %] 100 % (03/25 0829) Weight:  [69.536 kg (153 lb 4.8 oz)] 69.536 kg (153 lb 4.8 oz) (03/24 2159)  Weight change: 2.586 kg (5 lb 11.2 oz) Filed Weights   01/29/15 1639 01/29/15 2104 01/30/15 2159  Weight: 66.951 kg (147 lb 9.6 oz) 66.769 kg (147 lb 3.2 oz) 69.536 kg (153 lb 4.8 oz)    Intake/Output: I/O last 3 completed shifts: In: 3350 [P.O.:720; I.V.:2245; Blood:335; IV Piggyback:50] Out: 2801 [Urine:2800; Stool:1]   Intake/Output this shift:  Total I/O In: 240 [P.O.:240] Out: 550 [Urine:550]  CVS- RRR RS- CTA ABD- BS present soft non-distended EXT- no edema   Basic Metabolic Panel:  Recent Labs Lab 01/29/15 1124 01/29/15 1928 01/30/15 0130 01/30/15 0558 01/31/15 0720  NA 144 144 144 144 147*  K 6.1* 5.0 5.1 4.7 4.3  CL 125* 123* 121* 121* 122*  CO2 11* 12* 14* 16* 18*  GLUCOSE 114* 72 102* 102* 101*  BUN 109* 110* 106* 109* 81*  CREATININE 5.30* 4.80* 4.50* 4.41* 3.93*  CALCIUM 9.3 8.7 8.3* 8.4 8.0*    Liver Function Tests:  Recent Labs Lab 01/29/15 1124  AST 14  ALT 19  ALKPHOS 58  BILITOT 0.5  PROT 7.2  ALBUMIN 3.6   No results for input(s): LIPASE, AMYLASE in the last 168 hours. No results for input(s): AMMONIA in the last 168 hours.  CBC:  Recent Labs Lab 01/29/15 1124 01/30/15 0140 01/30/15 1843 01/31/15 0720  WBC 10.6* 9.8 7.5 6.9  NEUTROABS 7.9*  --   --   --   HGB 9.3* 7.5* 8.8* 8.2*  HCT 27.2* 21.3* 25.3* 23.6*  MCV 87.5 86.6 85.2 85.2  PLT 280 214 198 192    Cardiac Enzymes:  Recent Labs Lab 01/29/15 1928  CKTOTAL 47    BNP: Invalid input(s):  POCBNP  CBG: No results for input(s): GLUCAP in the last 168 hours.  Microbiology: Results for orders placed or performed during the hospital encounter of 01/29/15  Urine culture     Status: None (Preliminary result)   Collection Time: 01/30/15  3:12 AM  Result Value Ref Range Status   Specimen Description URINE, CATHETERIZED  Final   Special Requests NONE  Final   Colony Count   Final    25,000 COLONIES/ML Performed at [facility]    Culture   Final    [place] Performed at [facility]    Report Status PENDING  Incomplete    Coagulation Studies: No results for input(s): LABPROT, INR in the last 72 hours.  Urinalysis:  Recent Labs  01/29/15 1207  COLORURINE YELLOW  LABSPEC 1.012  PHURINE 6.5  GLUCOSEU NEGATIVE  HGBUR MODERATE*  BILIRUBINUR NEGATIVE  KETONESUR NEGATIVE  PROTEINUR 100*  UROBILINOGEN 0.2  NITRITE NEGATIVE  LEUKOCYTESUR LARGE*      Imaging: Dg Chest 2 View  01/30/2015   CLINICAL DATA:  History of bladder malignancy with significant weight loss over the past 9 months  EXAM: CHEST  2 VIEW  COMPARISON:  Portable chest x-ray of Mar 15, 2012  FINDINGS: The lungs are adequately inflated. There is  no focal infiltrate. There is no pulmonary parenchymal mass. Nipple shadows are visible projecting over the hemidiaphragms. The heart and pulmonary vascularity are normal. There is tortuosity of the descending thoracic aorta. The bony thorax is unremarkable.  IMPRESSION: There is no evidence of metastatic disease nor other active cardiopulmonary disease.   Electronically Signed   By: David  Martinique   On: 01/30/2015 13:32   US Renal  01/30/2015   CLINICAL DATA:  Acute kidney injury.  History of bladder cancer.  EXAM: RENAL/URINARY TRACT ULTRASOUND COMPLETE  COMPARISON:  CT 08/28/2012  FINDINGS: Right Kidney:  Length: 11.1 cm. There is mild thinning of the renal parenchyma and heterogeneous echogenicity. No mass or hydronephrosis visualized.   Left Kidney:  Length: 10.4 cm. There is moderate left hydroureteronephrosis. Left renal pelvis measures 2.2 cm. Proximal ureter measures 15 mm, the mid ureter measures 18 mm. There is thinning of the left renal parenchyma  Bladder:  Urinary bladder is decompressed by Foley catheter and not evaluated.  IMPRESSION: 1. Chronic left hydroureteronephrosis, this appears similar to prior CT. 2. No right hydronephrosis. There is mild thinning of the right renal parenchyma and heterogeneous echogenicity suggesting chronic medical renal disease.   Electronically Signed   By: Jeb Levering M.D.   On: 01/30/2015 03:19     Medications:   . sodium chloride 75 mL/hr at 01/31/15 0603   . ferrous sulfate  325 mg Oral Q breakfast  . heparin  5,000 Units Subcutaneous 3 times per day  . sodium bicarbonate  325 mg Oral BID     Assessment/ Plan:    Renal failure Reviewed U/S Chronic unilateral hydronephrosis Left.  Creatinine was 2.7  3 years ago and I believe this is progressive renal failure secondary to chronic reflux following his urological reconstruction We had a very frank discussion yesterday about goals of care and agree that dialysis would not be an optimal treatment. He has chosen  a more palliative approach.  Dr Gaynelle Arabian is his primary urologist and has been called although he is not available this week. There are 3 daughters that help and are also involved in Mr Higashi' care.  Metabolic acidosis Continue IV bicarbonate  Anemia consider transfusion and start ESA  Bones  [id number]  I will sign off as there is little to add at this point. Please call if I can be of any more help  [phone]   LOS: 2 Crystalynn Mcinerney W @TODAY @10 :08 AM

## 2015-01-31 NOTE — Care Management Note (Signed)
CARE MANAGEMENT NOTE 01/31/2015  Patient:  Rodney Obrien, Rodney Obrien   Account Number:  [id number]  Date Initiated:  01/31/2015  Documentation initiated by:  Trecia Maring  Subjective/Objective Assessment:   CM following for progression and d/c planning.     Action/Plan:   Met with pt re d/c needs. This pt is requesting [facility] for [facility] services. Call placed to [facility] to arrange home hospice services. Await return call from [place].   Anticipated DC Date:  02/02/2015   Anticipated DC Plan:  HOME [facility] CARE         Choice offered to / List presented to:          [facility] arranged  HH-1 RN  [place] AIDE      [place] agency  [facility] [place]   Status of service:  In process, will continue to follow Medicare Important Message given?   (If response is "NO", the following Medicare IM given date fields will be blank) Date Medicare IM given:   Medicare IM given by:   Date Additional Medicare IM given:   Additional Medicare IM given by:    Discharge Disposition:    Per UR Regulation:    If discussed at Long Length of Stay Meetings, dates discussed:    Comments:

## 2015-01-31 NOTE — Care Management Note (Signed)
CARE MANAGEMENT NOTE 01/31/2015  Patient:  Rodney Obrien, Rodney Obrien   Account Number:  [id number]  Date Initiated:  01/31/2015  Documentation initiated by:  Maricela Schreur  Subjective/Objective Assessment:   CM following for progression and d/c planning.     Action/Plan:   Met with pt re d/c needs. This pt is requesting [facility] for [facility] services. Call placed to [facility] to arrange home hospice services. Await return call from [place].   Anticipated DC Date:  02/02/2015   Anticipated DC Plan:  HOME [facility] CARE         Choice offered to / List presented to:          [facility] arranged  HH-1 RN  [place] AIDE      [place] agency  [facility] [place]   Status of service:  In process, will continue to follow Medicare Important Message given?   (If response is "NO", the following Medicare IM given date fields will be blank) Date Medicare IM given:   Medicare IM given by:   Date Additional Medicare IM given:   Additional Medicare IM given by:    Discharge Disposition:    Per UR Regulation:    If discussed at Long Length of Stay Meetings, dates discussed:    Comments:  01/31/2015 Received call back from [facility] @ 4:20pm. Info given to on call RN and all info faxed as requested to [phone].  Spoke with pt re plan for Hospice RN to come to the hospital tomorrow 02/01/15 to meet him, however ,if the plan changed they will call the pt in his room and make arrangement to meet him at his home. Pt understands the plan and is agreeable.   CRoyal RN MPH, case manager [phone]

## 2015-01-31 NOTE — Discharge Summary (Signed)
Name: Rodney Obrien MRN: [id number] DOB: Mar 28, 1926 81 y.o. PCP: No primary care provider on file.  Date of Admission: 01/29/2015 10:59 AM Date of Discharge: 01/31/2015 Attending Physician: Annia Belt, MD  Discharge Diagnosis: Principal Problem:   Acute on chronic renal failure Active Problems:   History of cancer   Hyperkalemia   Urinary tract infectious disease   Metabolic acidosis  Discharge Medications:   Medication List    ASK your doctor about these medications        ALIGN 4 MG Caps  Take 1 capsule by mouth daily.     CENTRUM ULTRA MENS PO  Take 1 tablet by mouth daily.     esomeprazole 40 MG capsule  Commonly known as:  NEXIUM  Take 40 mg by mouth daily at 12 noon.     ferrous sulfate 325 (65 FE) MG tablet  Take 325 mg by mouth daily with breakfast.     OSTEO BI-FLEX ADV TRIPLE ST PO  Take 2 tablets by mouth daily.        Disposition and follow-up:   Rodney Obrien was discharged from [facility] in Stable condition.  At the hospital follow up visit please address:  1.  Acute on Chronic Renal Failure: Creatinine improved from 5.9-->3.43 during this admission. Regardless of this improvement, the patient's kidney function worsened significantly since his last known outpatient evaluation. Patient does not wish to have dialysis or other invasive care. Palliative was consulted and hospice care was initiated.  Weight Loss: In the context of decreased appetite and a prior high-grade bladder cancer. Patient reports 29 pound weight loss. CXR, CT abdomen/pelvis/chest negative for metastatic disease. Followed closely by GI and urology. Needs PCP. Patient does want to know status of his bladder cancer / wants to be informed if his cancer has spread.  Code Status: Patient elected to be DNR during this admission. Patient has 3 step-daughters who occasionally check on him, but he was encouraged to establish private home caregivers; prior  to this admission, he was living along in 9 bedroom house and driving on his own.  2.  Labs / imaging needed at time of follow-up: BMET  3.  Pending labs/ test needing follow-up: IFE urine  Follow-up Appointments:     Follow-up Information    Follow up with Ailene Rud, MD.   Specialty:  Urology   Contact information:   [place] [address] [phone]       Follow up with Maris Berger, MD.   Specialty:  Family Medicine   Contact information:   [address] [place] [address] [place] [address] [phone]     Patient states that he would like to establish care with Dr. Selena Batten. In the meantime, he will have clinic follow up in [facility] (calling to schedule once office is open on 3/28) and through Hospice.   Consultations: Treatment Team:  Edrick Oh, MD Palliative [facility]  Procedures Performed:  Dg Chest 2 View  01/30/2015   CLINICAL DATA:  History of bladder malignancy with significant weight loss over the past 9 months  EXAM: CHEST  2 VIEW  COMPARISON:  Portable chest x-ray of Mar 15, 2012  FINDINGS: The lungs are adequately inflated. There is no focal infiltrate. There is no pulmonary parenchymal mass. Nipple shadows are visible projecting over the hemidiaphragms. The heart and pulmonary vascularity are normal. There is tortuosity of the descending thoracic aorta. The bony thorax is unremarkable.  IMPRESSION:  There is no evidence of metastatic disease nor other active cardiopulmonary disease.   Electronically Signed   By: David  Martinique   On: 01/30/2015 13:32   US Renal  01/30/2015   CLINICAL DATA:  Acute kidney injury.  History of bladder cancer.  EXAM: RENAL/URINARY TRACT ULTRASOUND COMPLETE  COMPARISON:  CT 08/28/2012  FINDINGS: Right Kidney:  Length: 11.1 cm. There is mild thinning of the renal parenchyma and heterogeneous echogenicity. No mass or hydronephrosis visualized.  Left Kidney:  Length: 10.4 cm. There is moderate left hydroureteronephrosis.  Left renal pelvis measures 2.2 cm. Proximal ureter measures 15 mm, the mid ureter measures 18 mm. There is thinning of the left renal parenchyma  Bladder:  Urinary bladder is decompressed by Foley catheter and not evaluated.  IMPRESSION: 1. Chronic left hydroureteronephrosis, this appears similar to prior CT. 2. No right hydronephrosis. There is mild thinning of the right renal parenchyma and heterogeneous echogenicity suggesting chronic medical renal disease.   Electronically Signed   By: Jeb Levering M.D.   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 Two weeks ago, he experienced vomiting and diarrhea that lasted for about 7 days and subsided on their own. His daughters, who check on him often, also reported that he appeared sick and thinner than usual. According to his records, he has lost 29 pounds over the past 9 months. He has experienced no subjective fever, chills, dysuria, hematuria, night sweats or pain.  Of note, the patient self-catheterizes and has done so since his surgery in 2000; his urologist is Dr. Era Bumpers. According to his family, he often repeatedly uses his catheter rather than using a new catheter each time as directed. Additionally, the patient's urinalysis in 06/2014 was positive for bacteria, WBC and protein.    Hospital Course by problem list: Principal Problem:   Acute on chronic renal failure Active Problems:   History of cancer   Hyperkalemia   Urinary tract infectious disease   Metabolic acidosis   Rodney Obrien is an 81 yo man with a history of bladder cancer for which he underwent cystoprostatectomy with ileal neobladder placement who self-catheterizes at home. He has been found to have chronic left hydronephrosis and a urinary tract infection in the  setting of what appears to be chronic renal failure secondary to chronic reflux following his urological reconstruction. He is not interested in pursuing dialysis or other invasive measures. He also has been found to have acute on chronic anemia and a significant recent weight loss.   Acute on Chronic Renal Failure: Found on admission to be hyperkalemic, acidotic and with a high creatinine; these values all trended toward normal after treatment with bicarbonate in dextrose, insulin, kayexelate, then sodium bicarbonate in sterile water. There were never changes on EKG. K 6.1-->4.3, CO2 11-->18. Cr 5.3-->3.93. He had some proteinuria and signs of infection on his UA and appeared dry on initial exam; he is now well-hydrated. Renal ultrasound demonstrated chronic (unchanged) left hydroureteronephrosis (dilation of the ureter, renal pelvis and calices). This patient self-catheterizes and has an ileal neobladder. He is not a chronic NSAID user. He has no known history of lupus or vasculitis. The most likely explanation for the patient's creatinine is a gradual progression of CKD that can be traced back the slightly  elevated creatinine in 2013 (1.7 per Dr. Arlyn Leak records); the most likely etiology is chronic reflux following his urological reconstruction, though nephrosclerosis and glomerulonephritis are also on the differential (less likely given his lack of many RBCs or excessive protein on his UA). Given that the patient would like palliative management of his renal issues (as opposed to HD), a biopsy would be unlikely to be appropriate at this time. PTH slightly elevated at 73. IFE urine pending. Given that patient would like a palliative approach his CKD, he and his family met with palliative care on 3/25; hospice was initiated.  Urinary Tract Infection: UA revealed large leukocytes, moderate hemoglobin, many WBC. There was hemoglobin in his urine, but this is to be expected in someone who  self-catheterizes. Urine culture with gram negative rods. WBC 10.6-->9.8-->6.9. Ceftriaxone course completed.  Normocytic Anemia: Has required transfusion in the past when his labs came back with a hemoglobin ~5.5 at an outpatient visit. His records refer to an iron deficiency anemia and he is on ferrous sulfate at home. His baseline hemoglobin is 9-10. The anemia is likely due to his renal insufficiency. On this admission, hemoglobin is 9.3-->7.5-->8.2.1 unit of packed red blood cells was given and the patient continued his home ferrous sulfate tablet 325 mg daily.   Weight Loss: 06/2014 he was 176 lbs-->147-->153 lbs (today). Does admit to decreased oral intake over last 2 weeks. Concerning with history of bladder cancer and Barrett's esophagus. CXR negative for evidence of malignancy, lymphadenopathy. CT chest/abdomen/pelvis negative for metastatic disease. Patient did state that he would like to know status of his bladder cancer / wants to be informed if his cancer has spread.  History of High Grade Bladder Cancer: Patient underwent cystoprostatectomy and now self-catheterizes. His significant weight loss is concerning for recurrence. He follows with Dr. Gaynelle Arabian (and previously followed with Dr. Hinton Rao, oncology).  Barrett's Esophagus: Patient is managed by Dr. Lyda Jester (GI) and takes Nexium 40 mg daily.  Hypogonadism: Receives hormone replacement therapy (testosterone). On Axiron at home.  Discharge Vitals:   BP 139/72 mmHg  Pulse 80  Temp(Src) 97.9 F (36.6 C) (Oral)  Resp 18  Ht $R'5\' 5"'cU$  (1.651 m)  Wt 153 lb 4.8 oz (69.536 kg)  BMI 25.51 kg/m2  SpO2 100%  Discharge Labs:  Results for orders placed or performed during the hospital encounter of 01/29/15 (from the past 24 hour(s))  Sodium, urine, random     Status: None   Collection Time: 01/30/15  3:36 PM  Result Value Ref Range   Sodium, Ur 42 mmol/L  Creatinine, urine, random     Status: None   Collection Time: 01/30/15   3:36 PM  Result Value Ref Range   Creatinine, Urine 72.79 mg/dL  CBC     Status: Abnormal   Collection Time: 01/30/15  6:43 PM  Result Value Ref Range   WBC 7.5 4.0 - 10.5 K/uL   RBC 2.97 (L) 4.22 - 5.81 MIL/uL   Hemoglobin 8.8 (L) 13.0 - 17.0 g/dL   HCT 25.3 (L) 39.0 - 52.0 %   MCV 85.2 78.0 - 100.0 fL   MCH 29.6 26.0 - 34.0 pg   MCHC 34.8 30.0 - 36.0 g/dL   RDW 17.2 (H) 11.5 - 15.5 %   Platelets 198 150 - 400 K/uL  Basic metabolic panel     Status: Abnormal   Collection Time: 01/31/15  7:20 AM  Result Value Ref Range   Sodium 147 (H) 135 - 145 mmol/L   Potassium  4.3 3.5 - 5.1 mmol/L   Chloride 122 (H) 96 - 112 mmol/L   CO2 18 (L) 19 - 32 mmol/L   Glucose, Bld 101 (H) 70 - 99 mg/dL   BUN 81 (H) 6 - 23 mg/dL   Creatinine, Ser 3.93 (H) 0.50 - 1.35 mg/dL   Calcium 8.0 (L) 8.4 - 10.5 mg/dL   GFR calc non Af Amer 13 (L) >90 mL/min   GFR calc Af Amer 15 (L) >90 mL/min   Anion gap 7 5 - 15  CBC     Status: Abnormal   Collection Time: 01/31/15  7:20 AM  Result Value Ref Range   WBC 6.9 4.0 - 10.5 K/uL   RBC 2.77 (L) 4.22 - 5.81 MIL/uL   Hemoglobin 8.2 (L) 13.0 - 17.0 g/dL   HCT 23.6 (L) 39.0 - 52.0 %   MCV 85.2 78.0 - 100.0 fL   MCH 29.6 26.0 - 34.0 pg   MCHC 34.7 30.0 - 36.0 g/dL   RDW 18.0 (H) 11.5 - 15.5 %   Platelets 192 150 - 400 K/uL    Signed: Karlene Einstein, MD 01/31/2015, 1:46 PM    Services Ordered on Discharge: Hospice Equipment Ordered on Discharge: none

## 2015-01-31 NOTE — Progress Notes (Signed)
Utilization review completed. Maygen Sirico, RN, BSN. 

## 2015-02-01 DIAGNOSIS — N189 Chronic kidney disease, unspecified: Secondary | ICD-10-CM | POA: Diagnosis present

## 2015-02-01 LAB — BASIC METABOLIC PANEL
Anion gap: 6 (ref 5–15)
BUN: 67 mg/dL — AB (ref 6–23)
CALCIUM: 8.2 mg/dL — AB (ref 8.4–10.5)
CO2: 18 mmol/L — AB (ref 19–32)
Chloride: 120 mmol/L — ABNORMAL HIGH (ref 96–112)
Creatinine, Ser: 3.43 mg/dL — ABNORMAL HIGH (ref 0.50–1.35)
GFR, EST AFRICAN AMERICAN: 17 mL/min — AB (ref >90)
GFR, EST NON AFRICAN AMERICAN: 15 mL/min — AB (ref >90)
Glucose, Bld: 87 mg/dL (ref 70–99)
Potassium: 4.3 mmol/L (ref 3.5–5.1)
SODIUM: 144 mmol/L (ref 135–145)

## 2015-02-01 LAB — URINE CULTURE: Colony Count: 25000

## 2015-02-01 MED ORDER — SENNA 8.6 MG PO TABS: 1.0000 | ORAL_TABLET | Freq: Every evening | ORAL | Status: AC | PRN

## 2015-02-01 MED ORDER — POTASSIUM CHLORIDE CRYS ER 20 MEQ PO TBCR
30.0000 meq | EXTENDED_RELEASE_TABLET | ORAL | Status: AC
  Administered 2015-02-01 (×2): 30 meq via ORAL
  Filled 2015-02-01 (×2): qty 1

## 2015-02-01 MED ORDER — SODIUM BICARBONATE 325 MG PO TABS: 325.0000 mg | ORAL_TABLET | Freq: Two times a day (BID) | ORAL | Status: AC

## 2015-02-01 NOTE — Progress Notes (Signed)
Subjective: Patient doing very well this morning. Lying in bed, with head of bed almost flat. No complainst today. Will like to go home today.    Objective: Vital signs in last 24 hours: Filed Vitals:   01/31/15 1720 01/31/15 2035 02/01/15 0558 02/01/15 0920  BP: 135/80 174/69 151/78 145/62  Pulse: 70 72 63 82  Temp: 98.2 F (36.8 C) 97.9 F (36.6 C) 97.7 F (36.5 C) 97.8 F (36.6 C)  TempSrc: Oral   Oral  Resp: 18 18 18 18   Height:      Weight:  157 lb (71.215 kg)    SpO2: 100% 99% 95% 94%   Weight change: 3 lb 11.2 oz (1.678 kg)  Intake/Output Summary (Last 24 hours) at 02/01/15 1038 Last data filed at 02/01/15 1034  Gross per 24 hour  Intake   1060 ml  Output   1280 ml  Net   -220 ml    Physical Exam: Appearance: in NAD, lying in bed, HEENT: AT/[place],  Lungs: Moving equal volumes of air, in no distress. GU: catheter in place Extremities: no edema Neurologic: A&Ox3, grossly intact Skin: no observable rashes or lesions  Lab Results: Basic Metabolic Panel:  Recent Labs Lab 01/31/15 0720 02/01/15 0455  NA 147* 144  K 4.3 4.3  CL 122* 120*  CO2 18* 18*  GLUCOSE 101* 87  BUN 81* 67*  CREATININE 3.93* 3.43*  CALCIUM 8.0* 8.2*   Liver Function Tests:  Recent Labs Lab 01/29/15 1124  AST 14  ALT 19  ALKPHOS 58  BILITOT 0.5  PROT 7.2  ALBUMIN 3.6   CBC:  Recent Labs Lab 01/29/15 1124  01/30/15 1843 01/31/15 0720  WBC 10.6*  < > 7.5 6.9  NEUTROABS 7.9*  --   --   --   HGB 9.3*  < > 8.8* 8.2*  HCT 27.2*  < > 25.3* 23.6*  MCV 87.5  < > 85.2 85.2  PLT 280  < > 198 192  < > = values in this interval not displayed. Cardiac Enzymes:  Recent Labs Lab 01/29/15 1928  CKTOTAL 47   Anemia Panel:  Recent Labs Lab 01/29/15 1928  VITAMINB12 1126*  FOLATE 12.5  FERRITIN 1261*  TIBC 181*  IRON 79  RETICCTPCT 0.7   Urinalysis:  Recent Labs Lab 01/29/15 1207  COLORURINE YELLOW  LABSPEC 1.012  PHURINE 6.5  GLUCOSEU NEGATIVE  HGBUR  MODERATE*  BILIRUBINUR NEGATIVE  KETONESUR NEGATIVE  PROTEINUR 100*  UROBILINOGEN 0.2  NITRITE NEGATIVE  LEUKOCYTESUR LARGE*    Studies/Results: Ct Abdomen Pelvis Wo Contrast  01/31/2015   CLINICAL DATA:   IMPRESSION: Stable postoperative changes with ileal conduit and neobladder in the pelvis. Stable chronic left hydronephrosis with overlying renal cortical thinning/ atrophy.  No evidence of locally recurrent or metastatic disease in the chest, abdomen or pelvis.  Cholelithiasis.  Small hiatal hernia.  Old granulomatous disease.   Electronically Signed   By: Rolm Baptise M.D.   On: 01/31/2015 20:49   Dg Chest 2 View  01/30/2015   CLINICAL DATA: IMPRESSION: There is no evidence of metastatic disease nor other active cardiopulmonary disease.   Electronically Signed   By: David  Martinique   On: 01/30/2015 13:32   Ct Chest Wo Contrast  01/31/2015   CLINICAL DATA:  I IMPRESSION: Stable postoperative changes with ileal conduit and neobladder in the pelvis. Stable chronic left hydronephrosis with overlying renal cortical thinning/ atrophy.  No evidence of locally recurrent or metastatic disease in the chest,  abdomen or pelvis.  Cholelithiasis.  Small hiatal hernia.  Old granulomatous disease.   Electronically Signed   By: Rolm Baptise M.D.   On: 01/31/2015 20:49   Medications: I have reviewed the patient's current medications. Scheduled Meds: . ferrous sulfate  325 mg Oral Q breakfast  . saccharomyces boulardii  250 mg Oral BID  . sodium bicarbonate  325 mg Oral BID   Continuous Infusions:   PRN Meds:. Assessment/Plan:  Mr. Rodney Obrien is an 81 yo man with a history of bladder cancer for which he underwent cystoprostatectomy with ileal neobladder placement who self-catheterizes at home. He has been found to have chronic left hydronephrosis and a urinary tract infection in the setting of what appears to be chronic renal failure secondary to chronic reflux following his urological  reconstruction. He is not interested in pursuing dialysis or other invasive measures. He also has been found to have acute on chronic anemia and a significant recent weight loss.   Acute on Chronic Renal Failure: Improving. Cr- 3.43 today, 3.9 yesterday, and was 5.3 on admission. K- 6.1, now- 4.3 today, status post 1 dose of kayexalate.  - IVF D/c - SPEP and UPEP pending - Palliative consulted, recs appreciated. CT scan abdomen and pelvis, and chest, done- negative for metastatic disease. - Follow up with [place] in clinic 1 week, BMET check. - Discharge home today.  Urinary Tract Infection: UA revealed large leukocytes, moderate hemoglobin, many WBC. UA- 25 000 gram negative rods.   Normocytic Anemia: Status post 1 unit of blood. Hgb 02/01/2015- 8.2. - 1 U PRBC given yesterday - Continue ferrous sulfate tablet 325 mg daily (home medication)   Weight Loss: 06/2014 he was 176 lbs-->147-->153 lbs (today). Does admit to decreased oral intake over last 2 weeks. Concerning with history of bladder cancer and Barrett's esophagus. - CXR pending - Followed closely by GI and urology; needs PCP  History of High Grade Bladder Cancer: Patient underwent cystoprostatectomy and now self-catheterizes. His significant weight loss is concerning for recurrence. He follows with Dr. Gaynelle Arabian (and previously followed with Dr. Hinton Rao, oncology).  Barrett's Esophagus: Patient is managed by Dr. Lyda Jester (GI) and takes Nexium 40 mg daily.  Hypogonadism: Receives hormone replacement therapy (testosterone). On Axiron at home.  Diet: Regular  DVT Ppx: heparin [place]  Dispo: Disposition is deferred at this time, awaiting improvement of current medical problems.  Anticipated discharge in approximately 1 day(s).   The patient does not have a current PCP (has used Dr. Lyda Jester, GI) and does need an [facility] hospital follow-up appointment after discharge.  The patient does not know have transportation limitations that  hinder transportation to clinic appointments.  .Services Needed at time of discharge: Y = Yes, Blank = No PT:   OT:   RN:   Equipment:   Other:     LOS: 3 days   Bethena Roys, MD 02/01/2015, 10:38 AM

## 2015-02-01 NOTE — Progress Notes (Signed)
Reviewed CT results. No obvious locally advanced disease on non-contrasted study. Symptoms attributed to chronic, progressive kidney disease, refused HD. Discussed with Dr. Justin Mend on 3/25 and resident Dr. Sherrine Maples. Plan for discharge today 3/26 with hospice services at home for progressive chronic kidney disease based on patients stated goals of care. Please call palliative team for questions or assistance related to discharge.  Lane Hacker, DO Palliative Medicine [phone]

## 2015-02-01 NOTE — Progress Notes (Signed)
Daughter, Jenny Reichmann, came to take patient home.  Discharge instructions reviewed with patient and family.  Questions answered.  Gave copy of prescription to [facility] to family.  Hospice has spoken to family and will meet patient at his residence this afternoon.  MD called and came to room to answer additional questions.  PIV and Foley Catheter removed without complication.  [employer] RN-BC, WTA.

## 2015-02-01 NOTE — Progress Notes (Signed)
Internal Medicine Attending  Date: 02/01/2015  Patient name: Rodney Obrien Medical record number: [id number] Date of birth: 1926-10-28 Age: 81 y.o. Gender: male  I saw and evaluated the patient. I reviewed the resident's note by Dr. Denton Brick and I agree with the resident's findings and plans as documented in her progress note.  Mr. Brandis feels well today and is looking forward to being discharged home. He's able to lie flat without any evidence of dyspnea. His creatinine and BUN continue to improve. We will try to arrange primary care in [facility] for ongoing management of his chronic kidney disease and other chronic medical problems.

## 2015-02-03 LAB — UIFE/LIGHT CHAINS/TP QN, 24-HR UR
ALPHA 2 UR: DETECTED — AB
Albumin, U: DETECTED
Alpha 1, Urine: DETECTED — AB
Beta, Urine: DETECTED — AB
Gamma Globulin, Urine: DETECTED — AB
TOTAL PROTEIN, URINE-UPE24: 107 mg/dL — AB (ref 5–25)

## 2015-09-15 DIAGNOSIS — N189 Chronic kidney disease, unspecified: Secondary | ICD-10-CM | POA: Diagnosis not present

## 2015-09-15 DIAGNOSIS — Z8551 Personal history of malignant neoplasm of bladder: Secondary | ICD-10-CM

## 2015-09-15 DIAGNOSIS — R634 Abnormal weight loss: Secondary | ICD-10-CM | POA: Diagnosis not present

## 2015-09-15 DIAGNOSIS — D649 Anemia, unspecified: Secondary | ICD-10-CM | POA: Diagnosis not present

## 2015-09-15 DIAGNOSIS — D509 Iron deficiency anemia, unspecified: Secondary | ICD-10-CM | POA: Diagnosis not present

## 2015-09-15 DIAGNOSIS — Z66 Do not resuscitate: Secondary | ICD-10-CM

## 2015-11-13 DIAGNOSIS — N186 End stage renal disease: Secondary | ICD-10-CM

## 2015-11-13 DIAGNOSIS — Z8551 Personal history of malignant neoplasm of bladder: Secondary | ICD-10-CM | POA: Diagnosis not present

## 2015-11-13 DIAGNOSIS — D649 Anemia, unspecified: Secondary | ICD-10-CM | POA: Diagnosis not present

## 2015-12-08 IMAGING — CT CT CHEST W/O CM
1 series · 1 of 2 positions shown · non-contrast
Comparison: CT abdomen and pelvis 08/28/2012. Ultrasound
01/29/2015.

CLINICAL DATA: Invasive bladder cancer diagnosed in 7112. Greater
than 30 lb weight loss in last 3 months. Renal failure. Chronic left
hydronephrosis.

EXAM:
CT CHEST, ABDOMEN AND PELVIS WITHOUT CONTRAST
TECHNIQUE: Multidetector CT imaging of the chest, abdomen and pelvis was
performed following the standard protocol without IV contrast.

[Series 100: scout · coronal · 0.6mm · 0.98mm/px · 1 of 2 slices shown]
[im 2/2]
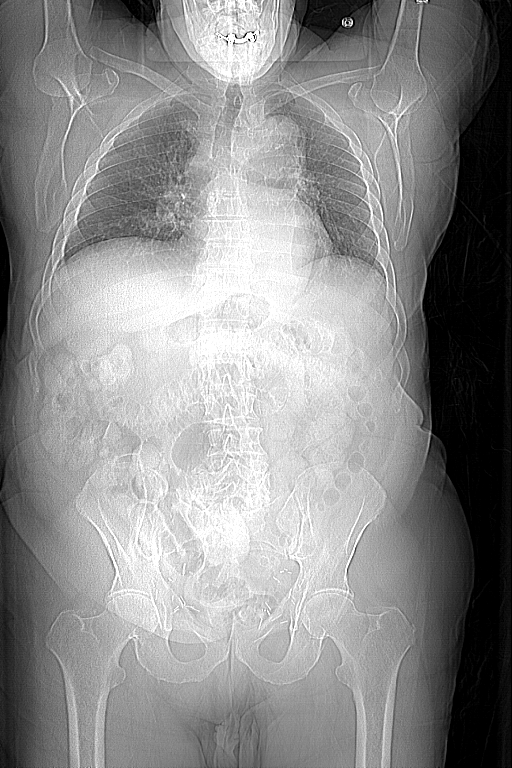

[1 of 2 positions shown; findings below may reference images not displayed]

FINDINGS: CT CHEST FINDINGS

Mediastinum/Nodes: Aortic arch and descending thoracic aorta
calcified. No aneurysm. Densely calcified left anterior descending
coronary artery with scattered calcifications in the right coronary
artery. Heart is normal size. Small hiatal hernia. Calcified right
hilar and subcarinal lymph nodes compatible with old granulomatous
disease. No mediastinal, hilar or axillary adenopathy.

Lungs/Pleura: Calcified granuloma in the right lower lobe. No areas
of consolidation. No other pulmonary nodules. No pleural effusions.

Musculoskeletal: Chest wall soft tissues are unremarkable. No acute
bony abnormality or focal bone lesion.

CT ABDOMEN PELVIS FINDINGS

Hepatobiliary: Multiple gallstones within the gallbladder, similar
to prior study. Liver has an unremarkable unenhanced appearance.

Pancreas: No focal abnormality or ductal dilatation.

Spleen: Calcified granulomas in the spleen, otherwise unremarkable
unenhanced appearance.

Adrenals/Urinary Tract: No adrenal mass. Chronic left hydronephrosis
is stable since 8678 CT. Overlying renal parenchymal thinning/
atrophy. No hydronephrosis on the right. Postoperative changes in
the pelvis with neo bladder in the upper pelvis and Foley catheter
in place. Right lower quadrant ileal conduit noted. Appearance is
stable since prior study.

Stomach/Bowel: Stomach, large and small bowel grossly unremarkable.

Vascular/Lymphatic: Aorta and iliac vessels are calcified, non
aneurysmal. No retroperitoneal or mesenteric adenopathy.

Reproductive: No mass or other significant abnormality.

Other: No free fluid or free air.

Musculoskeletal: No focal bone lesion or acute bony abnormality.
IMPRESSION: Stable postoperative changes with ileal conduit and neobladder in
the pelvis. Stable chronic left hydronephrosis with overlying renal
cortical thinning/ atrophy.

No evidence of locally recurrent or metastatic disease in the chest,
abdomen or pelvis.

Cholelithiasis.

Small hiatal hernia.

Old granulomatous disease.

## 2015-12-17 DIAGNOSIS — N185 Chronic kidney disease, stage 5: Secondary | ICD-10-CM | POA: Diagnosis not present

## 2015-12-17 DIAGNOSIS — D638 Anemia in other chronic diseases classified elsewhere: Secondary | ICD-10-CM | POA: Diagnosis not present

## 2015-12-17 DIAGNOSIS — C679 Malignant neoplasm of bladder, unspecified: Secondary | ICD-10-CM | POA: Diagnosis not present

## 2016-01-07 DEATH — deceased
